# Patient Record
Sex: Female | Born: 1945 | Race: White | Hispanic: No | Marital: Single | Smoking: Former smoker
Health system: Southern US, Community
[De-identification: ages and names within clinical notes are randomized; demographics above are authoritative.]

## PROBLEM LIST (undated history)

## (undated) DIAGNOSIS — E119 Type 2 diabetes mellitus without complications: Secondary | ICD-10-CM

## (undated) DIAGNOSIS — N3281 Overactive bladder: Secondary | ICD-10-CM

## (undated) DIAGNOSIS — I10 Essential (primary) hypertension: Secondary | ICD-10-CM

## (undated) DIAGNOSIS — E7800 Pure hypercholesterolemia, unspecified: Secondary | ICD-10-CM

## (undated) DIAGNOSIS — K219 Gastro-esophageal reflux disease without esophagitis: Secondary | ICD-10-CM

## (undated) HISTORY — DX: Gastro-esophageal reflux disease without esophagitis: K21.9

## (undated) HISTORY — DX: Pure hypercholesterolemia, unspecified: E78.00

## (undated) HISTORY — DX: Essential (primary) hypertension: I10

## (undated) HISTORY — DX: Type 2 diabetes mellitus without complications: E11.9

## (undated) HISTORY — DX: Overactive bladder: N32.81

---

## 1975-12-17 HISTORY — PX: ABDOMINAL HYSTERECTOMY: SHX81

## 1999-05-30 ENCOUNTER — Ambulatory Visit (HOSPITAL_BASED_OUTPATIENT_CLINIC_OR_DEPARTMENT_OTHER): Admission: RE | Admit: 1999-05-30 | Discharge: 1999-05-30 | Payer: Self-pay | Admitting: Orthopedic Surgery

## 2000-09-25 ENCOUNTER — Ambulatory Visit (HOSPITAL_BASED_OUTPATIENT_CLINIC_OR_DEPARTMENT_OTHER): Admission: RE | Admit: 2000-09-25 | Discharge: 2000-09-25 | Payer: Self-pay | Admitting: Orthopaedic Surgery

## 2003-01-31 ENCOUNTER — Ambulatory Visit (HOSPITAL_COMMUNITY): Admission: RE | Admit: 2003-01-31 | Discharge: 2003-01-31 | Payer: Self-pay | Admitting: Family Medicine

## 2003-01-31 ENCOUNTER — Encounter: Payer: Self-pay | Admitting: Family Medicine

## 2003-02-14 ENCOUNTER — Encounter: Payer: Self-pay | Admitting: Family Medicine

## 2003-02-14 ENCOUNTER — Ambulatory Visit (HOSPITAL_COMMUNITY): Admission: RE | Admit: 2003-02-14 | Discharge: 2003-02-14 | Payer: Self-pay | Admitting: Family Medicine

## 2010-04-27 ENCOUNTER — Inpatient Hospital Stay (HOSPITAL_COMMUNITY): Admission: RE | Admit: 2010-04-27 | Discharge: 2010-05-02 | Payer: Self-pay | Admitting: Internal Medicine

## 2010-04-30 ENCOUNTER — Encounter (INDEPENDENT_AMBULATORY_CARE_PROVIDER_SITE_OTHER): Payer: Self-pay

## 2011-03-04 LAB — DIFFERENTIAL
Basophils Absolute: 0 10*3/uL (ref 0.0–0.1)
Basophils Absolute: 0 10*3/uL (ref 0.0–0.1)
Basophils Absolute: 0 10*3/uL (ref 0.0–0.1)
Basophils Relative: 0 % (ref 0–1)
Eosinophils Absolute: 0.3 10*3/uL (ref 0.0–0.7)
Eosinophils Absolute: 0.3 10*3/uL (ref 0.0–0.7)
Eosinophils Relative: 3 % (ref 0–5)
Eosinophils Relative: 4 % (ref 0–5)
Lymphocytes Relative: 13 % (ref 12–46)
Lymphocytes Relative: 16 % (ref 12–46)
Lymphocytes Relative: 8 % — ABNORMAL LOW (ref 12–46)
Lymphs Abs: 0.8 10*3/uL (ref 0.7–4.0)
Monocytes Absolute: 0.7 10*3/uL (ref 0.1–1.0)
Monocytes Relative: 7 % (ref 3–12)
Neutro Abs: 4.6 10*3/uL (ref 1.7–7.7)
Neutro Abs: 8.6 10*3/uL — ABNORMAL HIGH (ref 1.7–7.7)
Neutrophils Relative %: 75 % (ref 43–77)
Neutrophils Relative %: 82 % — ABNORMAL HIGH (ref 43–77)

## 2011-03-04 LAB — CBC
HCT: 31.5 % — ABNORMAL LOW (ref 36.0–46.0)
HCT: 34.3 % — ABNORMAL LOW (ref 36.0–46.0)
Hemoglobin: 11.2 g/dL — ABNORMAL LOW (ref 12.0–15.0)
Hemoglobin: 12.1 g/dL (ref 12.0–15.0)
MCHC: 35.2 g/dL (ref 30.0–36.0)
MCV: 91.2 fL (ref 78.0–100.0)
MCV: 91.5 fL (ref 78.0–100.0)
Platelets: 249 10*3/uL (ref 150–400)
Platelets: 263 10*3/uL (ref 150–400)
RBC: 3.45 MIL/uL — ABNORMAL LOW (ref 3.87–5.11)
RBC: 3.74 MIL/uL — ABNORMAL LOW (ref 3.87–5.11)
RDW: 12.6 % (ref 11.5–15.5)
RDW: 12.6 % (ref 11.5–15.5)
WBC: 10.5 10*3/uL (ref 4.0–10.5)
WBC: 7.8 10*3/uL (ref 4.0–10.5)

## 2011-03-04 LAB — BASIC METABOLIC PANEL
BUN: 10 mg/dL (ref 6–23)
BUN: 4 mg/dL — ABNORMAL LOW (ref 6–23)
CO2: 27 mEq/L (ref 19–32)
Calcium: 8.1 mg/dL — ABNORMAL LOW (ref 8.4–10.5)
Calcium: 8.2 mg/dL — ABNORMAL LOW (ref 8.4–10.5)
Chloride: 99 mEq/L (ref 96–112)
Creatinine, Ser: 0.65 mg/dL (ref 0.4–1.2)
GFR calc Af Amer: >60 mL/min (ref >60)
GFR calc non Af Amer: >60 mL/min (ref >60)
GFR calc non Af Amer: >60 mL/min (ref >60)
Glucose, Bld: 144 mg/dL — ABNORMAL HIGH (ref 70–99)
Glucose, Bld: 88 mg/dL (ref 70–99)
Potassium: 2.8 mEq/L — ABNORMAL LOW (ref 3.5–5.1)
Sodium: 133 mEq/L — ABNORMAL LOW (ref 135–145)
Sodium: 138 mEq/L (ref 135–145)

## 2011-03-04 LAB — COMPREHENSIVE METABOLIC PANEL
AST: 12 U/L (ref 0–37)
CO2: 28 mEq/L (ref 19–32)
Chloride: 102 mEq/L (ref 96–112)
Creatinine, Ser: 0.56 mg/dL (ref 0.4–1.2)
GFR calc Af Amer: >60 mL/min (ref >60)
GFR calc non Af Amer: >60 mL/min (ref >60)
Glucose, Bld: 101 mg/dL — ABNORMAL HIGH (ref 70–99)
Total Bilirubin: 0.8 mg/dL (ref 0.3–1.2)

## 2011-03-04 LAB — MAGNESIUM
Magnesium: 1.8 mg/dL (ref 1.5–2.5)
Magnesium: 2 mg/dL (ref 1.5–2.5)

## 2011-03-05 LAB — CBC
HCT: 36.4 % (ref 36.0–46.0)
Hemoglobin: 12.7 g/dL (ref 12.0–15.0)
Hemoglobin: 12.8 g/dL (ref 12.0–15.0)
MCHC: 35.5 g/dL (ref 30.0–36.0)
MCV: 91.1 fL (ref 78.0–100.0)
MCV: 91.4 fL (ref 78.0–100.0)
Platelets: 259 10*3/uL (ref 150–400)
RBC: 3.94 MIL/uL (ref 3.87–5.11)
WBC: 14.4 10*3/uL — ABNORMAL HIGH (ref 4.0–10.5)

## 2011-03-05 LAB — DIFFERENTIAL
Basophils Absolute: 0 10*3/uL (ref 0.0–0.1)
Basophils Relative: 0 % (ref 0–1)
Basophils Relative: 0 % (ref 0–1)
Lymphocytes Relative: 8 % — ABNORMAL LOW (ref 12–46)
Monocytes Absolute: 1.2 10*3/uL — ABNORMAL HIGH (ref 0.1–1.0)
Monocytes Relative: 7 % (ref 3–12)
Neutro Abs: 12 10*3/uL — ABNORMAL HIGH (ref 1.7–7.7)
Neutro Abs: 13.7 10*3/uL — ABNORMAL HIGH (ref 1.7–7.7)
Neutrophils Relative %: 84 % — ABNORMAL HIGH (ref 43–77)

## 2011-03-05 LAB — CULTURE, BLOOD (ROUTINE X 2): Culture: NO GROWTH

## 2011-03-05 LAB — COMPREHENSIVE METABOLIC PANEL
Albumin: 2.9 g/dL — ABNORMAL LOW (ref 3.5–5.2)
BUN: 16 mg/dL (ref 6–23)
CO2: 28 mEq/L (ref 19–32)
Chloride: 93 mEq/L — ABNORMAL LOW (ref 96–112)
Creatinine, Ser: 0.83 mg/dL (ref 0.4–1.2)
GFR calc non Af Amer: >60 mL/min (ref >60)
Glucose, Bld: 94 mg/dL (ref 70–99)
Total Bilirubin: 1.5 mg/dL — ABNORMAL HIGH (ref 0.3–1.2)

## 2011-05-03 NOTE — Op Note (Signed)
[place]  Patient:    Makayla Lucas, Makayla Lucas             MRN: [id number] Proc. Date: 09/25/00 Adm. Date:  [id number] Disc. Date: [id number] Attending:  Marcene Corning                           Operative Report  PREOPERATIVE DIAGNOSES: 1. Left knee torn medial meniscus. 2. Left knee chondromalacia patella.  POSTOPERATIVE DIAGNOSES: 1. Left knee torn medial and torn lateral menisci. 2. Left knee chondromalacia patella.  PROCEDURE: 1. Left knee partial medial and partial lateral meniscectomies. 2. Left knee chondroplasty patella.  ANESTHESIA:  General.  SURGEON:  Lubertha Basque. Jerl Santos, M.D.  ASSISTANT:  Prince Rome, P.A.  INDICATION:   The patient is a 65 year old woman with a long history of left knee pain.  This is persistent despite oral anti-inflammatories and other measures.  At this point, she is offered operative intervention to consist of an arthropathy. This is a procedure she has undergone on the opposite side in a successful fashion.  The procedure was discussed with the patient.  An informed operative consent was obtained after discussion of possible complications, as well as anesthesia effects.  DESCRIPTION OF PROCEDURE:  The patient was seen in the operative suite where general anesthetic was applied without difficulty.  She was positioned supine, prepped and draped in the normal sterile fashion.  After the administration of preapplied IV antibiotics, an arthroscopy of the left knee was performed through two inferior portals.  Suprapatellar pouch was benign while the patellofemoral joint showed a grade 3 chondromalacia across most of both surfaces. this required a thorough chondroplasty of the patellofemoral joint.  There is some cartilaginous loose bodies in the knee which were removed.  Both gutters were otherwise benign.  The medial compartment exhibited degenerative tear of the posterior horn of the medial  meniscus. this was addressed with a partial medial meniscectomy taking about 10% of this structure back to a stable rim.  She had some grade 3 changes of very small areas of the medial femoral condyle and a chondroplasty was performed here. ACL, TCL appeared intact and she had some significant reactive synovitis in the notch.  In the lateral compartment she had an area of grade change on the outside of the lateral femoral condyle about the size of a dime.  She had some loose cartilage around this which required a chondroplasty back to stable surface. She had a small tear in the lateral meniscus which was addressed with a partial lateral menisectomy taking about 10% of this structure.  The kinee joint was then thoroughly irrigated followed by replacing the Marcaine with epinephrine and morphine plus Depo-Medrol.  Adaptic was placed over the two portals followed by a dry gauze and a loose Ace wrap.  Estimated blood loss and intraoperative fluids obtained from anesthesia records.  DISPOSITION:  The patient was extubated in the operating room and taken to the recovery room in stable condition.  Plans were for her to go home same day and to follow up in the office in less than a week.  I will contact her by phone tonight. DD:  09/25/00 TD:  09/26/00 Job: [id number]

## 2012-02-07 DIAGNOSIS — D235 Other benign neoplasm of skin of trunk: Secondary | ICD-10-CM | POA: Diagnosis not present

## 2012-02-07 DIAGNOSIS — D485 Neoplasm of uncertain behavior of skin: Secondary | ICD-10-CM | POA: Diagnosis not present

## 2012-02-21 DIAGNOSIS — L98499 Non-pressure chronic ulcer of skin of other sites with unspecified severity: Secondary | ICD-10-CM | POA: Diagnosis not present

## 2012-08-04 DIAGNOSIS — L255 Unspecified contact dermatitis due to plants, except food: Secondary | ICD-10-CM | POA: Diagnosis not present

## 2012-12-07 DIAGNOSIS — J069 Acute upper respiratory infection, unspecified: Secondary | ICD-10-CM | POA: Diagnosis not present

## 2013-02-08 DIAGNOSIS — L259 Unspecified contact dermatitis, unspecified cause: Secondary | ICD-10-CM | POA: Diagnosis not present

## 2013-02-08 DIAGNOSIS — L570 Actinic keratosis: Secondary | ICD-10-CM | POA: Diagnosis not present

## 2013-02-08 DIAGNOSIS — L821 Other seborrheic keratosis: Secondary | ICD-10-CM | POA: Diagnosis not present

## 2013-02-08 DIAGNOSIS — D485 Neoplasm of uncertain behavior of skin: Secondary | ICD-10-CM | POA: Diagnosis not present

## 2013-12-16 HISTORY — PX: LAPAROSCOPIC CHOLECYSTECTOMY: SUR755

## 2013-12-27 DIAGNOSIS — J069 Acute upper respiratory infection, unspecified: Secondary | ICD-10-CM | POA: Diagnosis not present

## 2014-01-13 DIAGNOSIS — R609 Edema, unspecified: Secondary | ICD-10-CM | POA: Diagnosis not present

## 2014-01-13 DIAGNOSIS — I10 Essential (primary) hypertension: Secondary | ICD-10-CM | POA: Diagnosis not present

## 2014-01-13 DIAGNOSIS — Z0000 Encounter for general adult medical examination without abnormal findings: Secondary | ICD-10-CM | POA: Diagnosis not present

## 2014-01-13 DIAGNOSIS — K219 Gastro-esophageal reflux disease without esophagitis: Secondary | ICD-10-CM | POA: Diagnosis not present

## 2014-03-02 DIAGNOSIS — L821 Other seborrheic keratosis: Secondary | ICD-10-CM | POA: Diagnosis not present

## 2014-03-02 DIAGNOSIS — L570 Actinic keratosis: Secondary | ICD-10-CM | POA: Diagnosis not present

## 2014-03-02 DIAGNOSIS — D485 Neoplasm of uncertain behavior of skin: Secondary | ICD-10-CM | POA: Diagnosis not present

## 2014-07-14 DIAGNOSIS — I10 Essential (primary) hypertension: Secondary | ICD-10-CM | POA: Diagnosis not present

## 2014-07-14 DIAGNOSIS — R7301 Impaired fasting glucose: Secondary | ICD-10-CM | POA: Diagnosis not present

## 2014-07-18 DIAGNOSIS — E782 Mixed hyperlipidemia: Secondary | ICD-10-CM | POA: Diagnosis not present

## 2014-07-18 DIAGNOSIS — I10 Essential (primary) hypertension: Secondary | ICD-10-CM | POA: Diagnosis not present

## 2014-07-18 DIAGNOSIS — K219 Gastro-esophageal reflux disease without esophagitis: Secondary | ICD-10-CM | POA: Diagnosis not present

## 2014-07-18 DIAGNOSIS — R252 Cramp and spasm: Secondary | ICD-10-CM | POA: Diagnosis not present

## 2014-11-18 DIAGNOSIS — I10 Essential (primary) hypertension: Secondary | ICD-10-CM | POA: Diagnosis not present

## 2014-11-18 DIAGNOSIS — R7301 Impaired fasting glucose: Secondary | ICD-10-CM | POA: Diagnosis not present

## 2014-11-18 DIAGNOSIS — E785 Hyperlipidemia, unspecified: Secondary | ICD-10-CM | POA: Diagnosis not present

## 2014-11-22 DIAGNOSIS — E1165 Type 2 diabetes mellitus with hyperglycemia: Secondary | ICD-10-CM | POA: Diagnosis not present

## 2014-11-22 DIAGNOSIS — J209 Acute bronchitis, unspecified: Secondary | ICD-10-CM | POA: Diagnosis not present

## 2014-11-22 DIAGNOSIS — E782 Mixed hyperlipidemia: Secondary | ICD-10-CM | POA: Diagnosis not present

## 2014-11-22 DIAGNOSIS — I10 Essential (primary) hypertension: Secondary | ICD-10-CM | POA: Diagnosis not present

## 2014-11-22 DIAGNOSIS — J069 Acute upper respiratory infection, unspecified: Secondary | ICD-10-CM | POA: Diagnosis not present

## 2014-11-22 DIAGNOSIS — J019 Acute sinusitis, unspecified: Secondary | ICD-10-CM | POA: Diagnosis not present

## 2014-11-22 DIAGNOSIS — Z0000 Encounter for general adult medical examination without abnormal findings: Secondary | ICD-10-CM | POA: Diagnosis not present

## 2015-02-28 DIAGNOSIS — L570 Actinic keratosis: Secondary | ICD-10-CM | POA: Diagnosis not present

## 2015-02-28 DIAGNOSIS — I872 Venous insufficiency (chronic) (peripheral): Secondary | ICD-10-CM | POA: Diagnosis not present

## 2015-03-20 DIAGNOSIS — I10 Essential (primary) hypertension: Secondary | ICD-10-CM | POA: Diagnosis not present

## 2015-03-20 DIAGNOSIS — E782 Mixed hyperlipidemia: Secondary | ICD-10-CM | POA: Diagnosis not present

## 2015-03-20 DIAGNOSIS — E1165 Type 2 diabetes mellitus with hyperglycemia: Secondary | ICD-10-CM | POA: Diagnosis not present

## 2015-03-23 DIAGNOSIS — E782 Mixed hyperlipidemia: Secondary | ICD-10-CM | POA: Diagnosis not present

## 2015-03-23 DIAGNOSIS — Z6838 Body mass index (BMI) 38.0-38.9, adult: Secondary | ICD-10-CM | POA: Diagnosis not present

## 2015-03-23 DIAGNOSIS — E1165 Type 2 diabetes mellitus with hyperglycemia: Secondary | ICD-10-CM | POA: Diagnosis not present

## 2015-03-23 DIAGNOSIS — I10 Essential (primary) hypertension: Secondary | ICD-10-CM | POA: Diagnosis not present

## 2016-02-28 DIAGNOSIS — L570 Actinic keratosis: Secondary | ICD-10-CM | POA: Diagnosis not present

## 2016-02-28 DIAGNOSIS — L309 Dermatitis, unspecified: Secondary | ICD-10-CM | POA: Diagnosis not present

## 2016-03-27 DIAGNOSIS — I10 Essential (primary) hypertension: Secondary | ICD-10-CM | POA: Diagnosis not present

## 2016-03-27 DIAGNOSIS — E782 Mixed hyperlipidemia: Secondary | ICD-10-CM | POA: Diagnosis not present

## 2016-03-27 DIAGNOSIS — E1165 Type 2 diabetes mellitus with hyperglycemia: Secondary | ICD-10-CM | POA: Diagnosis not present

## 2016-04-02 DIAGNOSIS — E876 Hypokalemia: Secondary | ICD-10-CM | POA: Diagnosis not present

## 2016-04-02 DIAGNOSIS — E782 Mixed hyperlipidemia: Secondary | ICD-10-CM | POA: Diagnosis not present

## 2016-04-02 DIAGNOSIS — E119 Type 2 diabetes mellitus without complications: Secondary | ICD-10-CM | POA: Diagnosis not present

## 2016-04-02 DIAGNOSIS — I10 Essential (primary) hypertension: Secondary | ICD-10-CM | POA: Diagnosis not present

## 2016-04-11 DIAGNOSIS — R05 Cough: Secondary | ICD-10-CM | POA: Diagnosis not present

## 2016-04-11 DIAGNOSIS — J302 Other seasonal allergic rhinitis: Secondary | ICD-10-CM | POA: Diagnosis not present

## 2016-04-11 DIAGNOSIS — J209 Acute bronchitis, unspecified: Secondary | ICD-10-CM | POA: Diagnosis not present

## 2016-09-12 DIAGNOSIS — E785 Hyperlipidemia, unspecified: Secondary | ICD-10-CM | POA: Diagnosis not present

## 2016-09-12 DIAGNOSIS — D509 Iron deficiency anemia, unspecified: Secondary | ICD-10-CM | POA: Diagnosis not present

## 2016-09-12 DIAGNOSIS — I10 Essential (primary) hypertension: Secondary | ICD-10-CM | POA: Diagnosis not present

## 2016-09-12 DIAGNOSIS — E039 Hypothyroidism, unspecified: Secondary | ICD-10-CM | POA: Diagnosis not present

## 2016-09-12 DIAGNOSIS — E782 Mixed hyperlipidemia: Secondary | ICD-10-CM | POA: Diagnosis not present

## 2016-09-12 DIAGNOSIS — I482 Chronic atrial fibrillation: Secondary | ICD-10-CM | POA: Diagnosis not present

## 2016-09-12 DIAGNOSIS — R7301 Impaired fasting glucose: Secondary | ICD-10-CM | POA: Diagnosis not present

## 2016-09-12 DIAGNOSIS — E119 Type 2 diabetes mellitus without complications: Secondary | ICD-10-CM | POA: Diagnosis not present

## 2016-09-16 DIAGNOSIS — K219 Gastro-esophageal reflux disease without esophagitis: Secondary | ICD-10-CM | POA: Diagnosis not present

## 2016-09-16 DIAGNOSIS — E119 Type 2 diabetes mellitus without complications: Secondary | ICD-10-CM | POA: Diagnosis not present

## 2016-09-16 DIAGNOSIS — Z23 Encounter for immunization: Secondary | ICD-10-CM | POA: Diagnosis not present

## 2016-09-16 DIAGNOSIS — I10 Essential (primary) hypertension: Secondary | ICD-10-CM | POA: Diagnosis not present

## 2016-09-16 DIAGNOSIS — Z0000 Encounter for general adult medical examination without abnormal findings: Secondary | ICD-10-CM | POA: Diagnosis not present

## 2016-09-16 DIAGNOSIS — E876 Hypokalemia: Secondary | ICD-10-CM | POA: Diagnosis not present

## 2016-09-16 DIAGNOSIS — R32 Unspecified urinary incontinence: Secondary | ICD-10-CM | POA: Diagnosis not present

## 2016-09-16 DIAGNOSIS — E782 Mixed hyperlipidemia: Secondary | ICD-10-CM | POA: Diagnosis not present

## 2016-12-06 DIAGNOSIS — J060 Acute laryngopharyngitis: Secondary | ICD-10-CM | POA: Diagnosis not present

## 2017-02-26 DIAGNOSIS — L570 Actinic keratosis: Secondary | ICD-10-CM | POA: Diagnosis not present

## 2017-02-26 DIAGNOSIS — L309 Dermatitis, unspecified: Secondary | ICD-10-CM | POA: Diagnosis not present

## 2018-03-02 DIAGNOSIS — L821 Other seborrheic keratosis: Secondary | ICD-10-CM | POA: Diagnosis not present

## 2018-03-02 DIAGNOSIS — B079 Viral wart, unspecified: Secondary | ICD-10-CM | POA: Diagnosis not present

## 2018-03-02 DIAGNOSIS — D485 Neoplasm of uncertain behavior of skin: Secondary | ICD-10-CM | POA: Diagnosis not present

## 2018-03-02 DIAGNOSIS — L570 Actinic keratosis: Secondary | ICD-10-CM | POA: Diagnosis not present

## 2018-03-26 DIAGNOSIS — I10 Essential (primary) hypertension: Secondary | ICD-10-CM | POA: Diagnosis not present

## 2018-03-26 DIAGNOSIS — E119 Type 2 diabetes mellitus without complications: Secondary | ICD-10-CM | POA: Diagnosis not present

## 2018-03-26 DIAGNOSIS — E782 Mixed hyperlipidemia: Secondary | ICD-10-CM | POA: Diagnosis not present

## 2018-03-30 DIAGNOSIS — E119 Type 2 diabetes mellitus without complications: Secondary | ICD-10-CM | POA: Diagnosis not present

## 2018-03-30 DIAGNOSIS — Z6837 Body mass index (BMI) 37.0-37.9, adult: Secondary | ICD-10-CM | POA: Diagnosis not present

## 2018-03-30 DIAGNOSIS — E782 Mixed hyperlipidemia: Secondary | ICD-10-CM | POA: Diagnosis not present

## 2018-03-30 DIAGNOSIS — I10 Essential (primary) hypertension: Secondary | ICD-10-CM | POA: Diagnosis not present

## 2018-03-30 DIAGNOSIS — K219 Gastro-esophageal reflux disease without esophagitis: Secondary | ICD-10-CM | POA: Diagnosis not present

## 2018-03-30 DIAGNOSIS — Z0000 Encounter for general adult medical examination without abnormal findings: Secondary | ICD-10-CM | POA: Diagnosis not present

## 2018-03-30 DIAGNOSIS — R32 Unspecified urinary incontinence: Secondary | ICD-10-CM | POA: Diagnosis not present

## 2018-03-31 ENCOUNTER — Other Ambulatory Visit (HOSPITAL_COMMUNITY): Payer: Self-pay | Admitting: Internal Medicine

## 2018-03-31 DIAGNOSIS — Z780 Asymptomatic menopausal state: Secondary | ICD-10-CM

## 2018-04-08 ENCOUNTER — Other Ambulatory Visit (HOSPITAL_COMMUNITY): Payer: Self-pay | Admitting: Internal Medicine

## 2018-04-08 DIAGNOSIS — Z1231 Encounter for screening mammogram for malignant neoplasm of breast: Secondary | ICD-10-CM

## 2018-04-17 ENCOUNTER — Other Ambulatory Visit (HOSPITAL_COMMUNITY): Payer: Self-pay

## 2018-04-17 ENCOUNTER — Ambulatory Visit (HOSPITAL_COMMUNITY): Payer: Self-pay

## 2018-04-22 ENCOUNTER — Encounter (HOSPITAL_COMMUNITY): Payer: Self-pay | Admitting: Radiology

## 2018-04-22 ENCOUNTER — Ambulatory Visit (HOSPITAL_COMMUNITY)
Admission: RE | Admit: 2018-04-22 | Discharge: 2018-04-22 | Disposition: A | Discharge status: Home / Self Care | Payer: Medicare Other | Source: Ambulatory Visit | Attending: Internal Medicine | Admitting: Internal Medicine

## 2018-04-22 DIAGNOSIS — Z780 Asymptomatic menopausal state: Secondary | ICD-10-CM

## 2018-04-22 DIAGNOSIS — Z13820 Encounter for screening for osteoporosis: Secondary | ICD-10-CM | POA: Insufficient documentation

## 2018-04-22 DIAGNOSIS — Z1231 Encounter for screening mammogram for malignant neoplasm of breast: Secondary | ICD-10-CM

## 2018-04-23 ENCOUNTER — Other Ambulatory Visit (HOSPITAL_COMMUNITY): Payer: Self-pay

## 2018-04-29 DIAGNOSIS — Z1212 Encounter for screening for malignant neoplasm of rectum: Secondary | ICD-10-CM | POA: Diagnosis not present

## 2018-04-29 DIAGNOSIS — Z1211 Encounter for screening for malignant neoplasm of colon: Secondary | ICD-10-CM | POA: Diagnosis not present

## 2018-04-29 LAB — COLOGUARD

## 2018-05-25 ENCOUNTER — Encounter: Payer: Self-pay | Admitting: Internal Medicine

## 2018-08-20 ENCOUNTER — Ambulatory Visit: Payer: Medicare Other | Admitting: Nurse Practitioner

## 2018-09-03 ENCOUNTER — Encounter: Payer: Self-pay | Admitting: Nurse Practitioner

## 2018-09-03 ENCOUNTER — Other Ambulatory Visit: Payer: Self-pay

## 2018-09-03 ENCOUNTER — Ambulatory Visit (INDEPENDENT_AMBULATORY_CARE_PROVIDER_SITE_OTHER): Payer: Medicare Other | Admitting: Nurse Practitioner

## 2018-09-03 DIAGNOSIS — R195 Other fecal abnormalities: Secondary | ICD-10-CM | POA: Diagnosis not present

## 2018-09-03 MED ORDER — NA SULFATE-K SULFATE-MG SULF 17.5-3.13-1.6 GM/177ML PO SOLN: 1.0000 | ORAL | 0 refills | Status: DC

## 2018-09-03 NOTE — Assessment & Plan Note (Signed)
The patient had a positive Cologuard with primary care.  She denies overt GI symptoms.  Specifically, no hematochezia, melena, change in stool habits, unintentional weight loss.  Generally no red flag or warning signs or symptoms.  Asymptomatic from a GI standpoint at this time.  We will proceed with colonoscopy due to positive Cologuard.  Return for follow-up based on post procedure recommendations.  Proceed with TCS with Dr. Gala Romney in near future: the risks, benefits, and alternatives have been discussed with the patient in detail. The patient states understanding and desires to proceed.  The patient is not on any anticoagulants, anxiolytics, chronic pain medications, or antidepressants.  Conscious sedation should be adequate for her procedure.

## 2018-09-03 NOTE — Progress Notes (Signed)
CC'D TO PCP °

## 2018-09-03 NOTE — Patient Instructions (Signed)
1. We will schedule your colonoscopy for you. 2. Further recommendations will be made after your colonoscopy. 3. Return for follow-up based on the recommendations made after your colonoscopy. 4. Call us if you have any questions or concerns.  At [facility] we value your feedback. You may receive a survey about your visit today. Please share your experience as we strive to create trusting relationships with our patients to provide genuine, compassionate, quality care.  We appreciate your understanding and patience as we review any laboratory studies, imaging, and other diagnostic tests that are ordered as we care for you. Our office policy is 5 business days for review of these results, and any emergent or urgent results are addressed in a timely manner for your best interest. If you do not hear from our office in 1 week, please contact us.   We also encourage the use of MyChart, which contains your medical information for your review as well. If you are not enrolled in this feature, an access code is on this after visit summary for your convenience. Thank you for allowing [place] to be involved in your care.  It was great to meet you today!  I hope you have a great Fall!!

## 2018-09-03 NOTE — Patient Instructions (Signed)
EG advised for pt to take 1/2 dose of Metformin night before, none morning of TCS. Noted on pt's instructions.

## 2018-09-03 NOTE — Progress Notes (Signed)
Primary Care Physician:  Celene Squibb, MD Primary Gastroenterologist:  Dr. Gala Romney  Chief Complaint  Patient presents with  . positive cologuard    never had TCS.     HPI:   Makayla Lucas is a 72 y.o. female who presents on referral from primary care for positive Cologuard.  Reviewed information provided with the referral including office visit dated 03/30/2018.  At that time it was noted the patient is due for colon cancer screening and elected Cologuard.  Cologuard result dated 04/29/2018 was positive.  CBC completed 03/26/2018 was normal, CMP essentially normal.  No history of colonoscopy in our system.  Today she states she's doing well overall. Has never had a colonoscopy before. Denies abdominal pain, N/V, hematochezia, melena, fever, chills, unintentional weight loss, acute changes in bowel movements; occasional/rare diarrhea. Denies chest pain, dyspnea, dizziness, lightheadedness, syncope, near syncope. Denies any other upper or lower GI symptoms.   Past Medical History:  Diagnosis Date  . Diabetes ([place])   . GERD (gastroesophageal reflux disease)   . Hypercholesterolemia   . Hypertension   . OAB (overactive bladder)     Past Surgical History:  Procedure Laterality Date  . ABDOMINAL HYSTERECTOMY  1977  . LAPAROSCOPIC CHOLECYSTECTOMY  2015    Current Outpatient Medications  Medication Sig Dispense Refill  . lisinopril-hydrochlorothiazide (PRINZIDE,ZESTORETIC) 20-12.5 MG tablet Take 1 tablet by mouth daily.    . metFORMIN (GLUCOPHAGE) 500 MG tablet Take 500 mg by mouth at bedtime.    Marland Kitchen omeprazole (PRILOSEC) 20 MG capsule Take 20 mg by mouth daily.    Marland Kitchen oxybutynin (DITROPAN) 5 MG tablet Take 1 tablet by mouth daily.    . potassium chloride (K-DUR,KLOR-CON) 10 MEQ tablet Take 1 tablet by mouth daily.    . pravastatin (PRAVACHOL) 20 MG tablet Take 1 tablet by mouth daily.     No current facility-administered medications for this visit.     Allergies as of 09/03/2018   . (Not on File)    Family History  Problem Relation Age of Onset  . Breast cancer Maternal Aunt   . Colon cancer Neg Hx     Social History   Socioeconomic History  . Marital status: Single    Spouse name: Not on file  . Number of children: Not on file  . Years of education: Not on file  . Highest education level: Not on file  Occupational History  . Not on file  Social Needs  . Financial resource strain: Not on file  . Food insecurity:    Worry: Not on file    Inability: Not on file  . Transportation needs:    Medical: Not on file    Non-medical: Not on file  Tobacco Use  . Smoking status: Former Smoker    Types: Cigarettes  . Smokeless tobacco: Never Used  Substance and Sexual Activity  . Alcohol use: Yes    Comment: occas  . Drug use: Never  . Sexual activity: Not on file  Lifestyle  . Physical activity:    Days per week: Not on file    Minutes per session: Not on file  . Stress: Not on file  Relationships  . Social connections:    Talks on phone: Not on file    Gets together: Not on file    Attends religious service: Not on file    Active member of club or organization: Not on file    Attends meetings of clubs or organizations: Not on  file    Relationship status: Not on file  . Intimate partner violence:    Fear of current or ex partner: Not on file    Emotionally abused: Not on file    Physically abused: Not on file    Forced sexual activity: Not on file  Other Topics Concern  . Not on file  Social History Narrative  . Not on file    Review of Systems: General: Negative for anorexia, weight loss, fever, chills, fatigue, weakness. Eyes: Negative for vision changes.  ENT: Negative for hoarseness, difficulty swallowing , nasal congestion. CV: Negative for chest pain, angina, palpitations, dyspnea on exertion, peripheral edema.  Respiratory: Negative for dyspnea at rest, dyspnea on exertion, cough, sputum, wheezing.  GI: See history of present  illness. GU:  Negative for dysuria, hematuria, urinary incontinence, urinary frequency, nocturnal urination.  MS: Negative for joint pain, low back pain.  Derm: Negative for rash or itching.  Neuro: Negative for weakness, abnormal sensation, seizure, frequent headaches, memory loss, confusion.  Psych: Negative for anxiety, depression, suicidal ideation, hallucinations.  Endo: Negative for unusual weight change.  Heme: Negative for bruising or bleeding. Allergy: Negative for rash or hives.    Physical Exam: BP 134/86   Pulse 79   Temp 97.7 F (36.5 C) (Oral)   Ht 5\' 4"  (1.626 m)   Wt 219 lb 3.2 oz (99.4 kg)   BMI 37.63 kg/m  General:   Alert and oriented. Pleasant and cooperative. Well-nourished and well-developed.  Head:  Normocephalic and atraumatic. Eyes:  Without icterus, sclera clear and conjunctiva pink.  Ears:  Normal auditory acuity. Mouth:  No deformity or lesions, oral mucosa pink.  Throat/Neck:  Supple, without mass or thyromegaly. Cardiovascular:  S1, S2 present without murmurs appreciated. Normal pulses noted. Extremities without clubbing or edema. Respiratory:  Clear to auscultation bilaterally. No wheezes, rales, or rhonchi. No distress.  Gastrointestinal:  +BS, soft, non-tender and non-distended. No HSM noted. No guarding or rebound. No masses appreciated.  Rectal:  Deferred  Musculoskalatal:  Symmetrical without gross deformities. Normal posture. Skin:  Intact without significant lesions or rashes. Neurologic:  Alert and oriented x4;  grossly normal neurologically. Psych:  Alert and cooperative. Normal mood and affect. Heme/Lymph/Immune: No significant cervical adenopathy. No excessive bruising noted.    09/03/2018 9:16 AM   Disclaimer: This note was dictated with voice recognition software. Similar sounding words can inadvertently be transcribed and may not be corrected upon review.

## 2018-10-15 DIAGNOSIS — Z6837 Body mass index (BMI) 37.0-37.9, adult: Secondary | ICD-10-CM | POA: Diagnosis not present

## 2018-10-15 DIAGNOSIS — E119 Type 2 diabetes mellitus without complications: Secondary | ICD-10-CM | POA: Diagnosis not present

## 2018-10-15 DIAGNOSIS — Z0000 Encounter for general adult medical examination without abnormal findings: Secondary | ICD-10-CM | POA: Diagnosis not present

## 2018-10-15 DIAGNOSIS — E782 Mixed hyperlipidemia: Secondary | ICD-10-CM | POA: Diagnosis not present

## 2018-10-15 DIAGNOSIS — K219 Gastro-esophageal reflux disease without esophagitis: Secondary | ICD-10-CM | POA: Diagnosis not present

## 2018-10-15 DIAGNOSIS — I10 Essential (primary) hypertension: Secondary | ICD-10-CM | POA: Diagnosis not present

## 2018-10-15 DIAGNOSIS — R32 Unspecified urinary incontinence: Secondary | ICD-10-CM | POA: Diagnosis not present

## 2018-10-16 ENCOUNTER — Other Ambulatory Visit: Payer: Self-pay

## 2018-10-16 ENCOUNTER — Encounter (HOSPITAL_COMMUNITY)
Admission: RE | Disposition: A | Discharge status: Home / Self Care | Payer: Self-pay | Source: Ambulatory Visit | Attending: Internal Medicine

## 2018-10-16 ENCOUNTER — Ambulatory Visit (HOSPITAL_COMMUNITY)
Admission: RE | Admit: 2018-10-16 | Discharge: 2018-10-16 | Disposition: A | Discharge status: Home / Self Care | Payer: Medicare Other | Source: Ambulatory Visit | Attending: Internal Medicine | Admitting: Internal Medicine

## 2018-10-16 ENCOUNTER — Encounter (HOSPITAL_COMMUNITY): Payer: Self-pay | Admitting: *Deleted

## 2018-10-16 DIAGNOSIS — Z7984 Long term (current) use of oral hypoglycemic drugs: Secondary | ICD-10-CM | POA: Diagnosis not present

## 2018-10-16 DIAGNOSIS — I10 Essential (primary) hypertension: Secondary | ICD-10-CM | POA: Insufficient documentation

## 2018-10-16 DIAGNOSIS — E119 Type 2 diabetes mellitus without complications: Secondary | ICD-10-CM | POA: Diagnosis not present

## 2018-10-16 DIAGNOSIS — Z87891 Personal history of nicotine dependence: Secondary | ICD-10-CM | POA: Diagnosis not present

## 2018-10-16 DIAGNOSIS — K219 Gastro-esophageal reflux disease without esophagitis: Secondary | ICD-10-CM | POA: Insufficient documentation

## 2018-10-16 DIAGNOSIS — D128 Benign neoplasm of rectum: Secondary | ICD-10-CM

## 2018-10-16 DIAGNOSIS — E7800 Pure hypercholesterolemia, unspecified: Secondary | ICD-10-CM | POA: Insufficient documentation

## 2018-10-16 DIAGNOSIS — D120 Benign neoplasm of cecum: Secondary | ICD-10-CM | POA: Insufficient documentation

## 2018-10-16 DIAGNOSIS — N3281 Overactive bladder: Secondary | ICD-10-CM | POA: Diagnosis not present

## 2018-10-16 DIAGNOSIS — K621 Rectal polyp: Secondary | ICD-10-CM | POA: Diagnosis not present

## 2018-10-16 DIAGNOSIS — K635 Polyp of colon: Secondary | ICD-10-CM

## 2018-10-16 DIAGNOSIS — R195 Other fecal abnormalities: Secondary | ICD-10-CM

## 2018-10-16 DIAGNOSIS — D125 Benign neoplasm of sigmoid colon: Secondary | ICD-10-CM | POA: Diagnosis not present

## 2018-10-16 HISTORY — PX: POLYPECTOMY: SHX5525

## 2018-10-16 HISTORY — PX: COLONOSCOPY: SHX5424

## 2018-10-16 LAB — GLUCOSE, CAPILLARY: GLUCOSE-CAPILLARY: 137 mg/dL — AB (ref 70–99)

## 2018-10-16 SURGERY — COLONOSCOPY
Anesthesia: Moderate Sedation

## 2018-10-16 MED ORDER — MIDAZOLAM HCL 5 MG/5ML IJ SOLN
INTRAMUSCULAR | Status: DC | PRN
  Administered 2018-10-16 (×3): 1 mg via INTRAVENOUS

## 2018-10-16 MED ORDER — STERILE WATER FOR IRRIGATION IR SOLN
Status: DC | PRN
  Administered 2018-10-16: 09:00:00

## 2018-10-16 MED ORDER — MIDAZOLAM HCL 5 MG/5ML IJ SOLN
INTRAMUSCULAR | Status: DC | PRN
  Administered 2018-10-16: 1 mg via INTRAVENOUS

## 2018-10-16 MED ORDER — MEPERIDINE HCL 100 MG/ML IJ SOLN
INTRAMUSCULAR | Status: DC | PRN
  Administered 2018-10-16: 25 mg via INTRAVENOUS

## 2018-10-16 MED ORDER — ONDANSETRON HCL 4 MG/2ML IJ SOLN
INTRAMUSCULAR | Status: AC
  Filled 2018-10-16: qty 2

## 2018-10-16 MED ORDER — ONDANSETRON HCL 4 MG/2ML IJ SOLN
INTRAMUSCULAR | Status: DC | PRN
  Administered 2018-10-16: 4 mg via INTRAVENOUS

## 2018-10-16 MED ORDER — MEPERIDINE HCL 50 MG/ML IJ SOLN
INTRAMUSCULAR | Status: AC
  Filled 2018-10-16: qty 1

## 2018-10-16 MED ORDER — MIDAZOLAM HCL 5 MG/5ML IJ SOLN
INTRAMUSCULAR | Status: AC
  Filled 2018-10-16: qty 10

## 2018-10-16 MED ORDER — SODIUM CHLORIDE 0.9 % IV SOLN
INTRAVENOUS | Status: DC
  Administered 2018-10-16: 1000 mL via INTRAVENOUS

## 2018-10-16 NOTE — Discharge Instructions (Signed)
Colon Polyps °Polyps are tissue growths inside the body. Polyps can grow in many places, including the large intestine (colon). A polyp may be a round bump or a mushroom-shaped growth. You could have one polyp or several. °Most colon polyps are noncancerous (benign). However, some colon polyps can become cancerous over time. °What are the causes? °The exact cause of colon polyps is not known. °What increases the risk? °This condition is more likely to develop in people who: °· Have a family history of colon cancer or colon polyps. °· Are older than 50 or older than 45 if they are African American. °· Have inflammatory bowel disease, such as ulcerative colitis or Crohn disease. °· Are overweight. °· Smoke cigarettes. °· Do not get enough exercise. °· Drink too much alcohol. °· Eat a diet that is: °? High in fat and red meat. °? Low in fiber. °· Had childhood cancer that was treated with abdominal radiation. ° °What are the signs or symptoms? °Most polyps do not cause symptoms. If you have symptoms, they may include: °· Blood coming from your rectum when having a bowel movement. °· Blood in your stool. The stool may look dark red or black. °· A change in bowel habits, such as constipation or diarrhea. ° °How is this diagnosed? °This condition is diagnosed with a colonoscopy. This is a procedure that uses a lighted, flexible scope to look at the inside of your colon. °How is this treated? °Treatment for this condition involves removing any polyps that are found. Those polyps will then be tested for cancer. If cancer is found, your health care provider will talk to you about options for colon cancer treatment. °Follow these instructions at home: °Diet °· Eat plenty of fiber, such as fruits, vegetables, and whole grains. °· Eat foods that are high in calcium and vitamin D, such as milk, cheese, yogurt, eggs, liver, fish, and broccoli. °· Limit foods high in fat, red meats, and processed meats, such as hot dogs, sausage,  bacon, and lunch meats. °· Maintain a healthy weight, or lose weight if recommended by your health care provider. °General instructions °· Do not smoke cigarettes. °· Do not drink alcohol excessively. °· Keep all follow-up visits as told by your health care provider. This is important. This includes keeping regularly scheduled colonoscopies. Talk to your health care provider about when you need a colonoscopy. °· Exercise every day or as told by your health care provider. °Contact a health care provider if: °· You have new or worsening bleeding during a bowel movement. °· You have new or increased blood in your stool. °· You have a change in bowel habits. °· You unexpectedly lose weight. °This information is not intended to replace advice given to you by your health care provider. Make sure you discuss any questions you have with your health care provider. °Document Released: 08/28/2004 Document Revised: 05/09/2016 Document Reviewed: 10/23/2015 °Elsevier Interactive Patient Education © 2018 [employer] ° °Colonoscopy °Discharge Instructions ° °Read the instructions outlined below and refer to this sheet in the next few weeks. These discharge instructions provide you with general information on caring for yourself after you leave the hospital. Your doctor may also give you specific instructions. While your treatment has been planned according to the most current medical practices available, unavoidable complications occasionally occur. If you have any problems or questions after discharge, call Dr. Rourk at [phone]. °ACTIVITY °· You may resume your regular activity, but move at a slower pace for the next 24   hours.  °· Take frequent rest periods for the next 24 hours.  °· Walking will help get rid of the air and reduce the bloated feeling in your belly (abdomen).  °· No driving for 24 hours (because of the medicine (anesthesia) used during the test).   °· Do not sign any important legal documents or operate any  machinery for 24 hours (because of the anesthesia used during the test).  °NUTRITION °· Drink plenty of fluids.  °· You may resume your normal diet as instructed by your doctor.  °· Begin with a light meal and progress to your normal diet. Heavy or fried foods are harder to digest and may make you feel sick to your stomach (nauseated).  °· Avoid alcoholic beverages for 24 hours or as instructed.  °MEDICATIONS °· You may resume your normal medications unless your doctor tells you otherwise.  °WHAT YOU CAN EXPECT TODAY °· Some feelings of bloating in the abdomen.  °· Passage of more gas than usual.  °· Spotting of blood in your stool or on the toilet paper.  °IF YOU HAD POLYPS REMOVED DURING THE COLONOSCOPY: °· No aspirin products for 7 days or as instructed.  °· No alcohol for 7 days or as instructed.  °· Eat a soft diet for the next 24 hours.  °FINDING OUT THE RESULTS OF YOUR TEST °Not all test results are available during your visit. If your test results are not back during the visit, make an appointment with your caregiver to find out the results. Do not assume everything is normal if you have not heard from your caregiver or the medical facility. It is important for you to follow up on all of your test results.  °SEEK IMMEDIATE MEDICAL ATTENTION IF: °· You have more than a spotting of blood in your stool.  °· Your belly is swollen (abdominal distention).  °· You are nauseated or vomiting.  °· You have a temperature over 101.  °· You have abdominal pain or discomfort that is severe or gets worse throughout the day.  ° ° ° °Colon polyp information provided ° °Further recommendations to follow pending review of pathology report °

## 2018-10-16 NOTE — Op Note (Signed)
[facility] Patient Name: Makayla Lucas Procedure Date: 10/16/2018 8:44 AM MRN: [id number] Date of Birth: 09/21/1946 Attending MD: Norvel Richards , MD CSN: [id number] Age: 72 Admit Type: Outpatient Procedure:                Colonoscopy Indications:              Positive Cologuard test Providers:                Norvel Richards, MD, Jeanann Lewandowsky. Gwenlyn Perking RN, RN,                            Aram Candela Referring MD:              Medicines:                Midazolam 4 mg IV, Meperidine 25 mg IV Complications:            No immediate complications. Estimated Blood Loss:     Estimated blood loss was minimal. Procedure:                Pre-Anesthesia Assessment:                           - Prior to the procedure, a History and Physical                            was performed, and patient medications and                            allergies were reviewed. The patient's tolerance of                            previous anesthesia was also reviewed. The risks                            and benefits of the procedure and the sedation                            options and risks were discussed with the patient.                            All questions were answered, and informed consent                            was obtained. Prior Anticoagulants: The patient has                            taken no previous anticoagulant or antiplatelet                            agents. ASA Grade Assessment: II - A patient with                            mild systemic disease. After reviewing the risks  and benefits, the patient was deemed in                            satisfactory condition to undergo the procedure.                           After obtaining informed consent, the colonoscope                            was passed under direct vision. Throughout the                            procedure, the patient's blood pressure, pulse, and                            oxygen  saturations were monitored continuously. The                            [id number]-[id number] ([id number]) scope was introduced through                            the anus and advanced to the the cecum, identified                            by appendiceal orifice and ileocecal valve. The                            colonoscopy was performed without difficulty. The                            patient tolerated the procedure well. The quality                            of the bowel preparation was adequate. Scope In: 9:18:12 AM Scope Out: 9:39:36 AM Scope Withdrawal Time: 0 hours 15 minutes 42 seconds  Total Procedure Duration: 0 hours 21 minutes 24 seconds  Findings:      The perianal and digital rectal examinations were normal.      Two sessile polyps were found in the rectum and ileocecal valve. The       polyps were 9 to 11 mm in size (11 sessile polyps 2 cm from anal       verge).. These polyps were removed with hot snare. Resection and       retrieval were complete. Estimated blood loss: none. (40 5-6 mm sessile       opyps removed from ICV and sigmoid segments.      The exam was otherwise without abnormality on direct and retroflexion       views. Impression:               - Two 9 to 11 mm polyps in the rectum and at the                            ileocecal valve, removed with a hot snare. Resected  and retrieved. 4 polyps cold snared as above.                           - The examination was otherwise normal on direct                            and retroflexion views. Moderate Sedation:      Moderate (conscious) sedation was administered by the endoscopy nurse       and supervised by the endoscopist. The following parameters were       monitored: oxygen saturation, heart rate, blood pressure, respiratory       rate, EKG, adequacy of pulmonary ventilation, and response to care.       Total physician intraservice time was 26 minutes. Recommendation:           - Patient has  a contact number available for                            emergencies. The signs and symptoms of potential                            delayed complications were discussed with the                            patient. Return to normal activities tomorrow.                            Written discharge instructions were provided to the                            patient.                           - Resume previous diet.                           - Continue present medications.                           - Await pathology results.                           - Repeat colonoscopy date to be determined after                            pending pathology results are reviewed for                            surveillance.                           - Return to GI office (date not yet determined). Procedure Code(s):        --- Professional ---                           [phone], Colonoscopy, flexible; with removal of  tumor(s), polyp(s), or other lesion(s) by snare                            technique                           99153, Moderate sedation; each additional 15                            minutes intraservice time                           G0500, Moderate sedation services provided by the                            same physician or other qualified health care                            professional performing a gastrointestinal                            endoscopic service that sedation supports,                            requiring the presence of an independent trained                            observer to assist in the monitoring of the                            patient's level of consciousness and physiological                            status; initial 15 minutes of intra-service time;                            patient age 24 years or older (additional time may                            be reported with [id number], as appropriate) Diagnosis Code(s):        ---  Professional ---                           K62.1, Rectal polyp                           D12.0, Benign neoplasm of cecum                           R19.5, Other fecal abnormalities CPT copyright 2018 American Medical Association. All rights reserved. The codes documented in this report are preliminary and upon coder review may  be revised to meet current compliance requirements. Cristopher Estimable. Sury Wentworth, MD Norvel Richards, MD 10/16/2018 9:50:29 AM This report has been signed electronically. Number of Addenda: 0

## 2018-10-16 NOTE — H&P (Signed)
@LOGO @   Primary Care Physician:  Celene Squibb, MD Primary Gastroenterologist:  Dr. Gala Romney  Pre-Procedure History & Physical: HPI:  Makayla Lucas is a 72 y.o. female here  for further evaluation of positive fecal DNA test.  No prior colonoscopy.  Past Medical History:  Diagnosis Date  . Diabetes ([place])   . GERD (gastroesophageal reflux disease)   . Hypercholesterolemia   . Hypertension   . OAB (overactive bladder)     Past Surgical History:  Procedure Laterality Date  . ABDOMINAL HYSTERECTOMY  1977  . LAPAROSCOPIC CHOLECYSTECTOMY  2015    Prior to Admission medications   Medication Sig Start Date End Date Taking? Authorizing Provider  lisinopril-hydrochlorothiazide (PRINZIDE,ZESTORETIC) 20-12.5 MG tablet Take 1 tablet by mouth daily. 06/11/18  Yes [provider]  omeprazole (PRILOSEC) 20 MG capsule Take 20 mg by mouth daily.   Yes [provider]  oxybutynin (DITROPAN) 5 MG tablet Take 1 tablet by mouth daily. 07/06/18  Yes [provider]  potassium chloride (K-DUR,KLOR-CON) 10 MEQ tablet Take 1 tablet by mouth daily. 06/11/18  Yes [provider]  pravastatin (PRAVACHOL) 20 MG tablet Take 1 tablet by mouth daily.   Yes [provider]  metFORMIN (GLUCOPHAGE) 500 MG tablet Take 500 mg by mouth at bedtime.    [provider]    Allergies as of 09/03/2018  . (Not on File)    Family History  Problem Relation Age of Onset  . Breast cancer Maternal Aunt   . Colon cancer Neg Hx     Social History   Socioeconomic History  . Marital status: Single    Spouse name: Not on file  . Number of children: Not on file  . Years of education: Not on file  . Highest education level: Not on file  Occupational History  . Not on file  Social Needs  . Financial resource strain: Not on file  . Food insecurity:    Worry: Not on file    Inability: Not on file  . Transportation needs:    Medical: Not on file    Non-medical: Not  on file  Tobacco Use  . Smoking status: Former Smoker    Types: Cigarettes  . Smokeless tobacco: Never Used  Substance and Sexual Activity  . Alcohol use: Yes    Comment: seldom  . Drug use: Never  . Sexual activity: Not on file  Lifestyle  . Physical activity:    Days per week: Not on file    Minutes per session: Not on file  . Stress: Not on file  Relationships  . Social connections:    Talks on phone: Not on file    Gets together: Not on file    Attends religious service: Not on file    Active member of club or organization: Not on file    Attends meetings of clubs or organizations: Not on file    Relationship status: Not on file  . Intimate partner violence:    Fear of current or ex partner: Not on file    Emotionally abused: Not on file    Physically abused: Not on file    Forced sexual activity: Not on file  Other Topics Concern  . Not on file  Social History Narrative  . Not on file    Review of Systems: See HPI, otherwise negative ROS  Physical Exam: There were no vitals taken for this visit. General:   Alert,  Well-developed, well-nourished, pleasant and cooperative  in NAD Neck:  Supple; no masses or thyromegaly. No significant cervical adenopathy. Lungs:  Clear throughout to auscultation.   No wheezes, crackles, or rhonchi. No acute distress. Heart:  Regular rate and rhythm; no murmurs, clicks, rubs,  or gallops. Abdomen: Non-distended, normal bowel sounds.  Soft and nontender without appreciable mass or hepatosplenomegaly.  Pulses:  Normal pulses noted. Extremities:  Without clubbing or edema.  Impression/Plan: 72 year old lady here for a positive fecal DNA test.  Diagnostic colonoscopy now being done.  The risks, benefits, limitations, alternatives and imponderables have been reviewed with the patient. Questions have been answered. All parties are agreeable.      Notice: This dictation was prepared with Dragon dictation along with smaller phrase  technology. Any transcriptional errors that result from this process are unintentional and may not be corrected upon review.

## 2018-10-21 ENCOUNTER — Encounter (HOSPITAL_COMMUNITY): Payer: Self-pay | Admitting: Internal Medicine

## 2018-10-22 DIAGNOSIS — Z23 Encounter for immunization: Secondary | ICD-10-CM | POA: Diagnosis not present

## 2018-10-22 DIAGNOSIS — E1169 Type 2 diabetes mellitus with other specified complication: Secondary | ICD-10-CM | POA: Diagnosis not present

## 2018-10-22 DIAGNOSIS — E782 Mixed hyperlipidemia: Secondary | ICD-10-CM | POA: Diagnosis not present

## 2018-10-22 DIAGNOSIS — I10 Essential (primary) hypertension: Secondary | ICD-10-CM | POA: Diagnosis not present

## 2018-10-22 DIAGNOSIS — K219 Gastro-esophageal reflux disease without esophagitis: Secondary | ICD-10-CM | POA: Diagnosis not present

## 2018-10-22 DIAGNOSIS — R32 Unspecified urinary incontinence: Secondary | ICD-10-CM | POA: Diagnosis not present

## 2018-10-22 DIAGNOSIS — Z0000 Encounter for general adult medical examination without abnormal findings: Secondary | ICD-10-CM | POA: Diagnosis not present

## 2018-10-23 ENCOUNTER — Encounter: Payer: Self-pay | Admitting: Internal Medicine

## 2019-03-03 DIAGNOSIS — D485 Neoplasm of uncertain behavior of skin: Secondary | ICD-10-CM | POA: Diagnosis not present

## 2019-03-03 DIAGNOSIS — L821 Other seborrheic keratosis: Secondary | ICD-10-CM | POA: Diagnosis not present

## 2019-03-03 DIAGNOSIS — I872 Venous insufficiency (chronic) (peripheral): Secondary | ICD-10-CM | POA: Diagnosis not present

## 2019-03-03 DIAGNOSIS — L570 Actinic keratosis: Secondary | ICD-10-CM | POA: Diagnosis not present

## 2019-03-03 DIAGNOSIS — D2272 Melanocytic nevi of left lower limb, including hip: Secondary | ICD-10-CM | POA: Diagnosis not present

## 2019-05-05 DIAGNOSIS — E782 Mixed hyperlipidemia: Secondary | ICD-10-CM | POA: Diagnosis not present

## 2019-05-05 DIAGNOSIS — I10 Essential (primary) hypertension: Secondary | ICD-10-CM | POA: Diagnosis not present

## 2019-05-05 DIAGNOSIS — E119 Type 2 diabetes mellitus without complications: Secondary | ICD-10-CM | POA: Diagnosis not present

## 2019-05-11 DIAGNOSIS — I10 Essential (primary) hypertension: Secondary | ICD-10-CM | POA: Diagnosis not present

## 2019-05-11 DIAGNOSIS — E782 Mixed hyperlipidemia: Secondary | ICD-10-CM | POA: Diagnosis not present

## 2019-05-11 DIAGNOSIS — E1169 Type 2 diabetes mellitus with other specified complication: Secondary | ICD-10-CM | POA: Diagnosis not present

## 2019-05-11 DIAGNOSIS — Z86010 Personal history of colonic polyps: Secondary | ICD-10-CM | POA: Diagnosis not present

## 2019-05-11 DIAGNOSIS — R32 Unspecified urinary incontinence: Secondary | ICD-10-CM | POA: Diagnosis not present

## 2019-05-11 DIAGNOSIS — K219 Gastro-esophageal reflux disease without esophagitis: Secondary | ICD-10-CM | POA: Diagnosis not present

## 2019-06-01 ENCOUNTER — Other Ambulatory Visit (HOSPITAL_COMMUNITY): Payer: Self-pay | Admitting: Internal Medicine

## 2019-06-01 DIAGNOSIS — Z1231 Encounter for screening mammogram for malignant neoplasm of breast: Secondary | ICD-10-CM

## 2019-06-07 ENCOUNTER — Ambulatory Visit (HOSPITAL_COMMUNITY)
Admission: RE | Admit: 2019-06-07 | Discharge: 2019-06-07 | Disposition: A | Discharge status: Home / Self Care | Payer: Medicare Other | Source: Ambulatory Visit | Attending: Internal Medicine | Admitting: Internal Medicine

## 2019-06-07 ENCOUNTER — Other Ambulatory Visit: Payer: Self-pay

## 2019-06-07 DIAGNOSIS — Z1231 Encounter for screening mammogram for malignant neoplasm of breast: Secondary | ICD-10-CM | POA: Diagnosis not present

## 2019-07-19 ENCOUNTER — Other Ambulatory Visit: Payer: Self-pay

## 2019-11-15 DIAGNOSIS — I10 Essential (primary) hypertension: Secondary | ICD-10-CM | POA: Diagnosis not present

## 2019-11-15 DIAGNOSIS — E119 Type 2 diabetes mellitus without complications: Secondary | ICD-10-CM | POA: Diagnosis not present

## 2019-11-15 DIAGNOSIS — E782 Mixed hyperlipidemia: Secondary | ICD-10-CM | POA: Diagnosis not present

## 2019-11-15 DIAGNOSIS — E1169 Type 2 diabetes mellitus with other specified complication: Secondary | ICD-10-CM | POA: Diagnosis not present

## 2019-11-17 DIAGNOSIS — E1169 Type 2 diabetes mellitus with other specified complication: Secondary | ICD-10-CM | POA: Diagnosis not present

## 2019-11-17 DIAGNOSIS — Z23 Encounter for immunization: Secondary | ICD-10-CM | POA: Diagnosis not present

## 2019-11-17 DIAGNOSIS — K219 Gastro-esophageal reflux disease without esophagitis: Secondary | ICD-10-CM | POA: Diagnosis not present

## 2019-11-17 DIAGNOSIS — I10 Essential (primary) hypertension: Secondary | ICD-10-CM | POA: Diagnosis not present

## 2019-11-17 DIAGNOSIS — E782 Mixed hyperlipidemia: Secondary | ICD-10-CM | POA: Diagnosis not present

## 2019-11-17 DIAGNOSIS — R32 Unspecified urinary incontinence: Secondary | ICD-10-CM | POA: Diagnosis not present

## 2019-11-18 DIAGNOSIS — Z23 Encounter for immunization: Secondary | ICD-10-CM | POA: Diagnosis not present

## 2020-01-18 DIAGNOSIS — E782 Mixed hyperlipidemia: Secondary | ICD-10-CM | POA: Diagnosis not present

## 2020-01-18 DIAGNOSIS — R32 Unspecified urinary incontinence: Secondary | ICD-10-CM | POA: Diagnosis not present

## 2020-01-18 DIAGNOSIS — E7849 Other hyperlipidemia: Secondary | ICD-10-CM | POA: Diagnosis not present

## 2020-01-18 DIAGNOSIS — E1169 Type 2 diabetes mellitus with other specified complication: Secondary | ICD-10-CM | POA: Diagnosis not present

## 2020-01-18 DIAGNOSIS — Z86010 Personal history of colonic polyps: Secondary | ICD-10-CM | POA: Diagnosis not present

## 2020-01-18 DIAGNOSIS — K219 Gastro-esophageal reflux disease without esophagitis: Secondary | ICD-10-CM | POA: Diagnosis not present

## 2020-01-18 DIAGNOSIS — I10 Essential (primary) hypertension: Secondary | ICD-10-CM | POA: Diagnosis not present

## 2020-01-20 DIAGNOSIS — Z23 Encounter for immunization: Secondary | ICD-10-CM | POA: Diagnosis not present

## 2020-02-03 DIAGNOSIS — E782 Mixed hyperlipidemia: Secondary | ICD-10-CM | POA: Diagnosis not present

## 2020-02-03 DIAGNOSIS — E1169 Type 2 diabetes mellitus with other specified complication: Secondary | ICD-10-CM | POA: Diagnosis not present

## 2020-02-03 DIAGNOSIS — R32 Unspecified urinary incontinence: Secondary | ICD-10-CM | POA: Diagnosis not present

## 2020-02-03 DIAGNOSIS — K219 Gastro-esophageal reflux disease without esophagitis: Secondary | ICD-10-CM | POA: Diagnosis not present

## 2020-02-03 DIAGNOSIS — I10 Essential (primary) hypertension: Secondary | ICD-10-CM | POA: Diagnosis not present

## 2020-02-03 DIAGNOSIS — Z86010 Personal history of colonic polyps: Secondary | ICD-10-CM | POA: Diagnosis not present

## 2020-02-03 DIAGNOSIS — Z0001 Encounter for general adult medical examination with abnormal findings: Secondary | ICD-10-CM | POA: Diagnosis not present

## 2020-02-18 DIAGNOSIS — Z23 Encounter for immunization: Secondary | ICD-10-CM | POA: Diagnosis not present

## 2020-03-01 DIAGNOSIS — D1801 Hemangioma of skin and subcutaneous tissue: Secondary | ICD-10-CM | POA: Diagnosis not present

## 2020-03-01 DIAGNOSIS — L814 Other melanin hyperpigmentation: Secondary | ICD-10-CM | POA: Diagnosis not present

## 2020-03-01 DIAGNOSIS — L570 Actinic keratosis: Secondary | ICD-10-CM | POA: Diagnosis not present

## 2020-03-01 DIAGNOSIS — L821 Other seborrheic keratosis: Secondary | ICD-10-CM | POA: Diagnosis not present

## 2020-04-17 DIAGNOSIS — I10 Essential (primary) hypertension: Secondary | ICD-10-CM | POA: Diagnosis not present

## 2020-04-17 DIAGNOSIS — E782 Mixed hyperlipidemia: Secondary | ICD-10-CM | POA: Diagnosis not present

## 2020-04-17 DIAGNOSIS — E1169 Type 2 diabetes mellitus with other specified complication: Secondary | ICD-10-CM | POA: Diagnosis not present

## 2020-04-17 DIAGNOSIS — E7849 Other hyperlipidemia: Secondary | ICD-10-CM | POA: Diagnosis not present

## 2020-04-17 DIAGNOSIS — Z86010 Personal history of colonic polyps: Secondary | ICD-10-CM | POA: Diagnosis not present

## 2020-04-17 DIAGNOSIS — K219 Gastro-esophageal reflux disease without esophagitis: Secondary | ICD-10-CM | POA: Diagnosis not present

## 2020-04-17 DIAGNOSIS — R32 Unspecified urinary incontinence: Secondary | ICD-10-CM | POA: Diagnosis not present

## 2020-04-26 DIAGNOSIS — E1169 Type 2 diabetes mellitus with other specified complication: Secondary | ICD-10-CM | POA: Diagnosis not present

## 2020-04-26 DIAGNOSIS — Z0000 Encounter for general adult medical examination without abnormal findings: Secondary | ICD-10-CM | POA: Diagnosis not present

## 2020-04-26 DIAGNOSIS — E7849 Other hyperlipidemia: Secondary | ICD-10-CM | POA: Diagnosis not present

## 2020-04-26 DIAGNOSIS — Z86010 Personal history of colonic polyps: Secondary | ICD-10-CM | POA: Diagnosis not present

## 2020-04-26 DIAGNOSIS — I10 Essential (primary) hypertension: Secondary | ICD-10-CM | POA: Diagnosis not present

## 2020-04-26 DIAGNOSIS — R32 Unspecified urinary incontinence: Secondary | ICD-10-CM | POA: Diagnosis not present

## 2020-04-26 DIAGNOSIS — K219 Gastro-esophageal reflux disease without esophagitis: Secondary | ICD-10-CM | POA: Diagnosis not present

## 2020-04-26 DIAGNOSIS — Z6837 Body mass index (BMI) 37.0-37.9, adult: Secondary | ICD-10-CM | POA: Diagnosis not present

## 2020-04-26 DIAGNOSIS — Z0001 Encounter for general adult medical examination with abnormal findings: Secondary | ICD-10-CM | POA: Diagnosis not present

## 2020-04-26 DIAGNOSIS — E119 Type 2 diabetes mellitus without complications: Secondary | ICD-10-CM | POA: Diagnosis not present

## 2020-04-26 DIAGNOSIS — E782 Mixed hyperlipidemia: Secondary | ICD-10-CM | POA: Diagnosis not present

## 2020-05-01 DIAGNOSIS — E1169 Type 2 diabetes mellitus with other specified complication: Secondary | ICD-10-CM | POA: Diagnosis not present

## 2020-05-01 DIAGNOSIS — E782 Mixed hyperlipidemia: Secondary | ICD-10-CM | POA: Diagnosis not present

## 2020-05-01 DIAGNOSIS — Z23 Encounter for immunization: Secondary | ICD-10-CM | POA: Diagnosis not present

## 2020-05-01 DIAGNOSIS — Z0001 Encounter for general adult medical examination with abnormal findings: Secondary | ICD-10-CM | POA: Diagnosis not present

## 2020-05-01 DIAGNOSIS — R32 Unspecified urinary incontinence: Secondary | ICD-10-CM | POA: Diagnosis not present

## 2020-05-01 DIAGNOSIS — K219 Gastro-esophageal reflux disease without esophagitis: Secondary | ICD-10-CM | POA: Diagnosis not present

## 2020-05-01 DIAGNOSIS — I10 Essential (primary) hypertension: Secondary | ICD-10-CM | POA: Diagnosis not present

## 2020-07-17 DIAGNOSIS — I10 Essential (primary) hypertension: Secondary | ICD-10-CM | POA: Diagnosis not present

## 2020-07-17 DIAGNOSIS — E7849 Other hyperlipidemia: Secondary | ICD-10-CM | POA: Diagnosis not present

## 2020-07-17 DIAGNOSIS — K219 Gastro-esophageal reflux disease without esophagitis: Secondary | ICD-10-CM | POA: Diagnosis not present

## 2020-07-17 DIAGNOSIS — E1169 Type 2 diabetes mellitus with other specified complication: Secondary | ICD-10-CM | POA: Diagnosis not present

## 2020-07-17 DIAGNOSIS — Z86010 Personal history of colonic polyps: Secondary | ICD-10-CM | POA: Diagnosis not present

## 2020-07-17 DIAGNOSIS — R32 Unspecified urinary incontinence: Secondary | ICD-10-CM | POA: Diagnosis not present

## 2020-07-17 DIAGNOSIS — E782 Mixed hyperlipidemia: Secondary | ICD-10-CM | POA: Diagnosis not present

## 2020-08-22 DIAGNOSIS — K219 Gastro-esophageal reflux disease without esophagitis: Secondary | ICD-10-CM | POA: Diagnosis not present

## 2020-08-22 DIAGNOSIS — I10 Essential (primary) hypertension: Secondary | ICD-10-CM | POA: Diagnosis not present

## 2020-08-22 DIAGNOSIS — Z86010 Personal history of colonic polyps: Secondary | ICD-10-CM | POA: Diagnosis not present

## 2020-08-22 DIAGNOSIS — E782 Mixed hyperlipidemia: Secondary | ICD-10-CM | POA: Diagnosis not present

## 2020-08-22 DIAGNOSIS — R32 Unspecified urinary incontinence: Secondary | ICD-10-CM | POA: Diagnosis not present

## 2020-08-22 DIAGNOSIS — E7849 Other hyperlipidemia: Secondary | ICD-10-CM | POA: Diagnosis not present

## 2020-08-22 DIAGNOSIS — E1169 Type 2 diabetes mellitus with other specified complication: Secondary | ICD-10-CM | POA: Diagnosis not present

## 2020-09-01 ENCOUNTER — Other Ambulatory Visit (HOSPITAL_COMMUNITY): Payer: Self-pay | Admitting: Internal Medicine

## 2020-09-01 DIAGNOSIS — Z1231 Encounter for screening mammogram for malignant neoplasm of breast: Secondary | ICD-10-CM

## 2020-09-06 ENCOUNTER — Ambulatory Visit (HOSPITAL_COMMUNITY)
Admission: RE | Admit: 2020-09-06 | Discharge: 2020-09-06 | Disposition: A | Discharge status: Home / Self Care | Payer: Medicare Other | Source: Ambulatory Visit | Attending: Internal Medicine | Admitting: Internal Medicine

## 2020-09-06 ENCOUNTER — Other Ambulatory Visit: Payer: Self-pay

## 2020-09-06 DIAGNOSIS — Z1231 Encounter for screening mammogram for malignant neoplasm of breast: Secondary | ICD-10-CM | POA: Insufficient documentation

## 2020-09-20 DIAGNOSIS — R32 Unspecified urinary incontinence: Secondary | ICD-10-CM | POA: Diagnosis not present

## 2020-09-20 DIAGNOSIS — E782 Mixed hyperlipidemia: Secondary | ICD-10-CM | POA: Diagnosis not present

## 2020-09-20 DIAGNOSIS — I10 Essential (primary) hypertension: Secondary | ICD-10-CM | POA: Diagnosis not present

## 2020-09-20 DIAGNOSIS — K219 Gastro-esophageal reflux disease without esophagitis: Secondary | ICD-10-CM | POA: Diagnosis not present

## 2020-09-20 DIAGNOSIS — Z86010 Personal history of colonic polyps: Secondary | ICD-10-CM | POA: Diagnosis not present

## 2020-09-20 DIAGNOSIS — E1169 Type 2 diabetes mellitus with other specified complication: Secondary | ICD-10-CM | POA: Diagnosis not present

## 2020-09-20 DIAGNOSIS — E7849 Other hyperlipidemia: Secondary | ICD-10-CM | POA: Diagnosis not present

## 2020-10-19 DIAGNOSIS — Z23 Encounter for immunization: Secondary | ICD-10-CM | POA: Diagnosis not present

## 2020-10-25 DIAGNOSIS — Z86010 Personal history of colonic polyps: Secondary | ICD-10-CM | POA: Diagnosis not present

## 2020-10-25 DIAGNOSIS — E1169 Type 2 diabetes mellitus with other specified complication: Secondary | ICD-10-CM | POA: Diagnosis not present

## 2020-10-25 DIAGNOSIS — K219 Gastro-esophageal reflux disease without esophagitis: Secondary | ICD-10-CM | POA: Diagnosis not present

## 2020-10-25 DIAGNOSIS — R32 Unspecified urinary incontinence: Secondary | ICD-10-CM | POA: Diagnosis not present

## 2020-10-25 DIAGNOSIS — I10 Essential (primary) hypertension: Secondary | ICD-10-CM | POA: Diagnosis not present

## 2020-10-25 DIAGNOSIS — E782 Mixed hyperlipidemia: Secondary | ICD-10-CM | POA: Diagnosis not present

## 2020-10-25 DIAGNOSIS — E7849 Other hyperlipidemia: Secondary | ICD-10-CM | POA: Diagnosis not present

## 2020-11-01 DIAGNOSIS — E782 Mixed hyperlipidemia: Secondary | ICD-10-CM | POA: Diagnosis not present

## 2020-11-01 DIAGNOSIS — Z0000 Encounter for general adult medical examination without abnormal findings: Secondary | ICD-10-CM | POA: Diagnosis not present

## 2020-11-01 DIAGNOSIS — E119 Type 2 diabetes mellitus without complications: Secondary | ICD-10-CM | POA: Diagnosis not present

## 2020-11-01 DIAGNOSIS — K219 Gastro-esophageal reflux disease without esophagitis: Secondary | ICD-10-CM | POA: Diagnosis not present

## 2020-11-01 DIAGNOSIS — E1169 Type 2 diabetes mellitus with other specified complication: Secondary | ICD-10-CM | POA: Diagnosis not present

## 2020-11-01 DIAGNOSIS — I10 Essential (primary) hypertension: Secondary | ICD-10-CM | POA: Diagnosis not present

## 2020-11-01 DIAGNOSIS — R32 Unspecified urinary incontinence: Secondary | ICD-10-CM | POA: Diagnosis not present

## 2020-11-01 DIAGNOSIS — Z712 Person consulting for explanation of examination or test findings: Secondary | ICD-10-CM | POA: Diagnosis not present

## 2020-11-01 DIAGNOSIS — Z86010 Personal history of colonic polyps: Secondary | ICD-10-CM | POA: Diagnosis not present

## 2020-11-01 DIAGNOSIS — Z6837 Body mass index (BMI) 37.0-37.9, adult: Secondary | ICD-10-CM | POA: Diagnosis not present

## 2020-11-01 DIAGNOSIS — E7849 Other hyperlipidemia: Secondary | ICD-10-CM | POA: Diagnosis not present

## 2020-11-06 DIAGNOSIS — I10 Essential (primary) hypertension: Secondary | ICD-10-CM | POA: Diagnosis not present

## 2020-11-06 DIAGNOSIS — K219 Gastro-esophageal reflux disease without esophagitis: Secondary | ICD-10-CM | POA: Diagnosis not present

## 2020-11-06 DIAGNOSIS — Z23 Encounter for immunization: Secondary | ICD-10-CM | POA: Diagnosis not present

## 2020-11-06 DIAGNOSIS — E1169 Type 2 diabetes mellitus with other specified complication: Secondary | ICD-10-CM | POA: Diagnosis not present

## 2020-11-06 DIAGNOSIS — R32 Unspecified urinary incontinence: Secondary | ICD-10-CM | POA: Diagnosis not present

## 2020-11-06 DIAGNOSIS — E782 Mixed hyperlipidemia: Secondary | ICD-10-CM | POA: Diagnosis not present

## 2020-12-15 DIAGNOSIS — I10 Essential (primary) hypertension: Secondary | ICD-10-CM | POA: Diagnosis not present

## 2020-12-15 DIAGNOSIS — Z23 Encounter for immunization: Secondary | ICD-10-CM | POA: Diagnosis not present

## 2020-12-15 DIAGNOSIS — E782 Mixed hyperlipidemia: Secondary | ICD-10-CM | POA: Diagnosis not present

## 2020-12-15 DIAGNOSIS — R32 Unspecified urinary incontinence: Secondary | ICD-10-CM | POA: Diagnosis not present

## 2020-12-15 DIAGNOSIS — E1169 Type 2 diabetes mellitus with other specified complication: Secondary | ICD-10-CM | POA: Diagnosis not present

## 2020-12-15 DIAGNOSIS — K219 Gastro-esophageal reflux disease without esophagitis: Secondary | ICD-10-CM | POA: Diagnosis not present

## 2021-01-04 DIAGNOSIS — E1136 Type 2 diabetes mellitus with diabetic cataract: Secondary | ICD-10-CM | POA: Diagnosis not present

## 2021-01-04 DIAGNOSIS — H2513 Age-related nuclear cataract, bilateral: Secondary | ICD-10-CM | POA: Diagnosis not present

## 2021-02-12 DIAGNOSIS — K219 Gastro-esophageal reflux disease without esophagitis: Secondary | ICD-10-CM | POA: Diagnosis not present

## 2021-02-12 DIAGNOSIS — E782 Mixed hyperlipidemia: Secondary | ICD-10-CM | POA: Diagnosis not present

## 2021-02-12 DIAGNOSIS — I10 Essential (primary) hypertension: Secondary | ICD-10-CM | POA: Diagnosis not present

## 2021-02-12 DIAGNOSIS — R32 Unspecified urinary incontinence: Secondary | ICD-10-CM | POA: Diagnosis not present

## 2021-02-12 DIAGNOSIS — E1169 Type 2 diabetes mellitus with other specified complication: Secondary | ICD-10-CM | POA: Diagnosis not present

## 2021-03-14 DIAGNOSIS — K219 Gastro-esophageal reflux disease without esophagitis: Secondary | ICD-10-CM | POA: Diagnosis not present

## 2021-03-14 DIAGNOSIS — L309 Dermatitis, unspecified: Secondary | ICD-10-CM | POA: Diagnosis not present

## 2021-03-14 DIAGNOSIS — L821 Other seborrheic keratosis: Secondary | ICD-10-CM | POA: Diagnosis not present

## 2021-03-14 DIAGNOSIS — I10 Essential (primary) hypertension: Secondary | ICD-10-CM | POA: Diagnosis not present

## 2021-03-14 DIAGNOSIS — L570 Actinic keratosis: Secondary | ICD-10-CM | POA: Diagnosis not present

## 2021-03-14 DIAGNOSIS — R32 Unspecified urinary incontinence: Secondary | ICD-10-CM | POA: Diagnosis not present

## 2021-03-14 DIAGNOSIS — E1169 Type 2 diabetes mellitus with other specified complication: Secondary | ICD-10-CM | POA: Diagnosis not present

## 2021-03-14 DIAGNOSIS — E782 Mixed hyperlipidemia: Secondary | ICD-10-CM | POA: Diagnosis not present

## 2021-03-21 DIAGNOSIS — I10 Essential (primary) hypertension: Secondary | ICD-10-CM | POA: Diagnosis not present

## 2021-03-21 DIAGNOSIS — E1169 Type 2 diabetes mellitus with other specified complication: Secondary | ICD-10-CM | POA: Diagnosis not present

## 2021-03-21 DIAGNOSIS — E7849 Other hyperlipidemia: Secondary | ICD-10-CM | POA: Diagnosis not present

## 2021-03-21 DIAGNOSIS — E782 Mixed hyperlipidemia: Secondary | ICD-10-CM | POA: Diagnosis not present

## 2021-03-21 DIAGNOSIS — Z6837 Body mass index (BMI) 37.0-37.9, adult: Secondary | ICD-10-CM | POA: Diagnosis not present

## 2021-03-27 DIAGNOSIS — Z23 Encounter for immunization: Secondary | ICD-10-CM | POA: Diagnosis not present

## 2021-03-28 DIAGNOSIS — E782 Mixed hyperlipidemia: Secondary | ICD-10-CM | POA: Diagnosis not present

## 2021-03-28 DIAGNOSIS — Z712 Person consulting for explanation of examination or test findings: Secondary | ICD-10-CM | POA: Diagnosis not present

## 2021-03-28 DIAGNOSIS — E1169 Type 2 diabetes mellitus with other specified complication: Secondary | ICD-10-CM | POA: Diagnosis not present

## 2021-03-28 DIAGNOSIS — R32 Unspecified urinary incontinence: Secondary | ICD-10-CM | POA: Diagnosis not present

## 2021-03-28 DIAGNOSIS — K219 Gastro-esophageal reflux disease without esophagitis: Secondary | ICD-10-CM | POA: Diagnosis not present

## 2021-03-28 DIAGNOSIS — I10 Essential (primary) hypertension: Secondary | ICD-10-CM | POA: Diagnosis not present

## 2021-05-16 IMAGING — MG DIGITAL SCREENING BILATERAL MAMMOGRAM WITH TOMO AND CAD
8 series · 8 of 24 positions shown · non-contrast
Comparison: Previous exam(s).

CLINICAL DATA: Screening.

EXAM:
DIGITAL SCREENING BILATERAL MAMMOGRAM WITH TOMO AND CAD

[L MLO synth-2D]
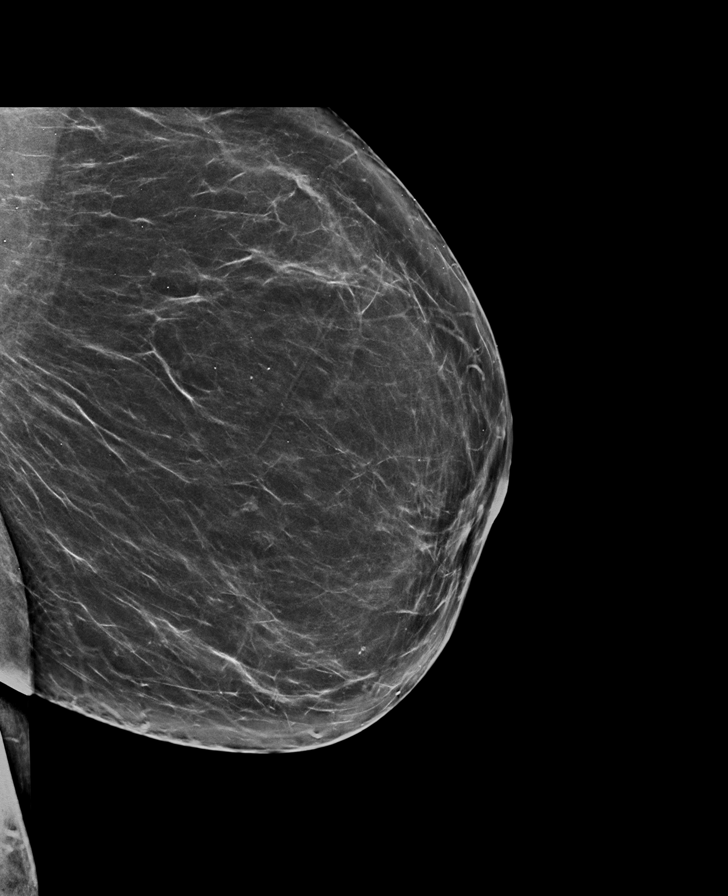

[R MLO synth-2D]
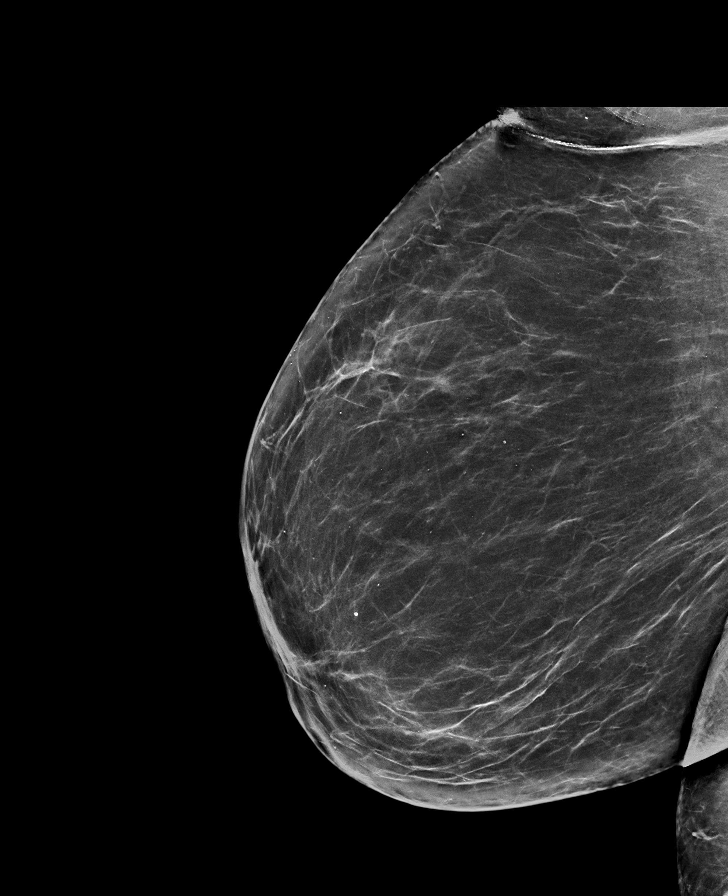

[R CC synth-2D]
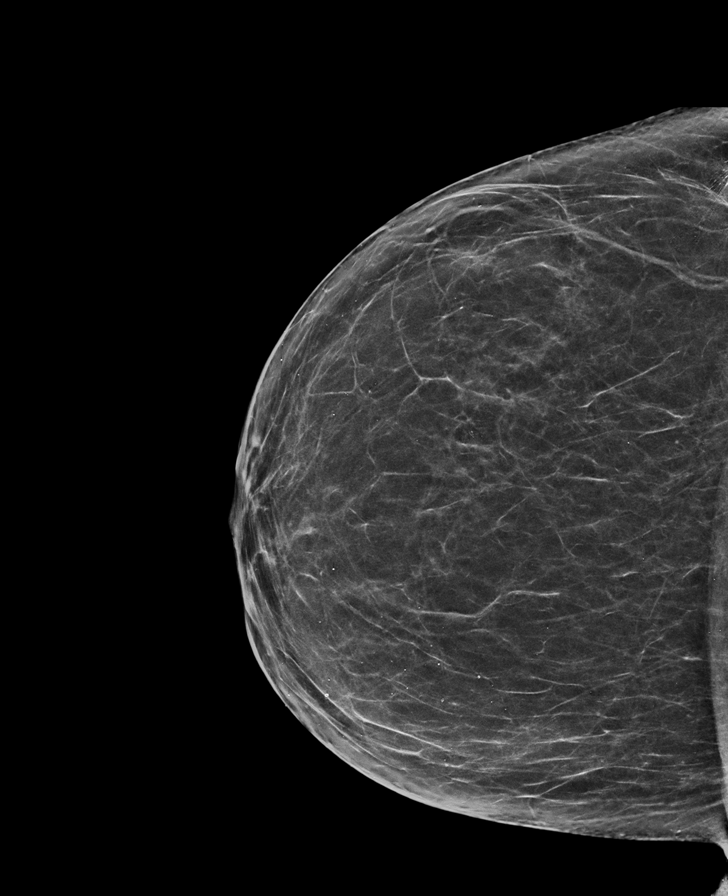

[L CC synth-2D]
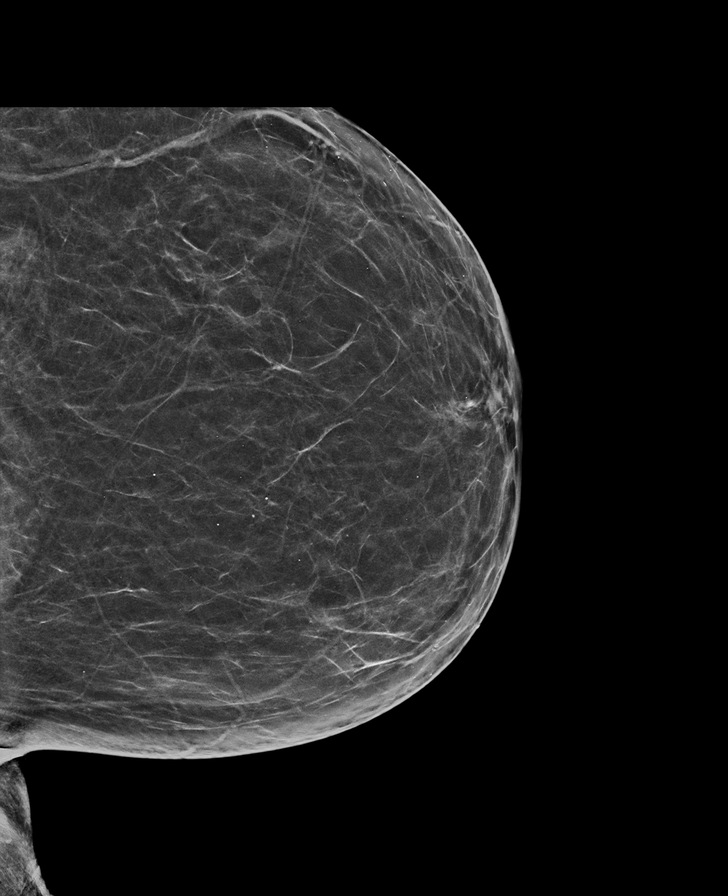

[R MLO tomo · tomo slice 39/78.0]
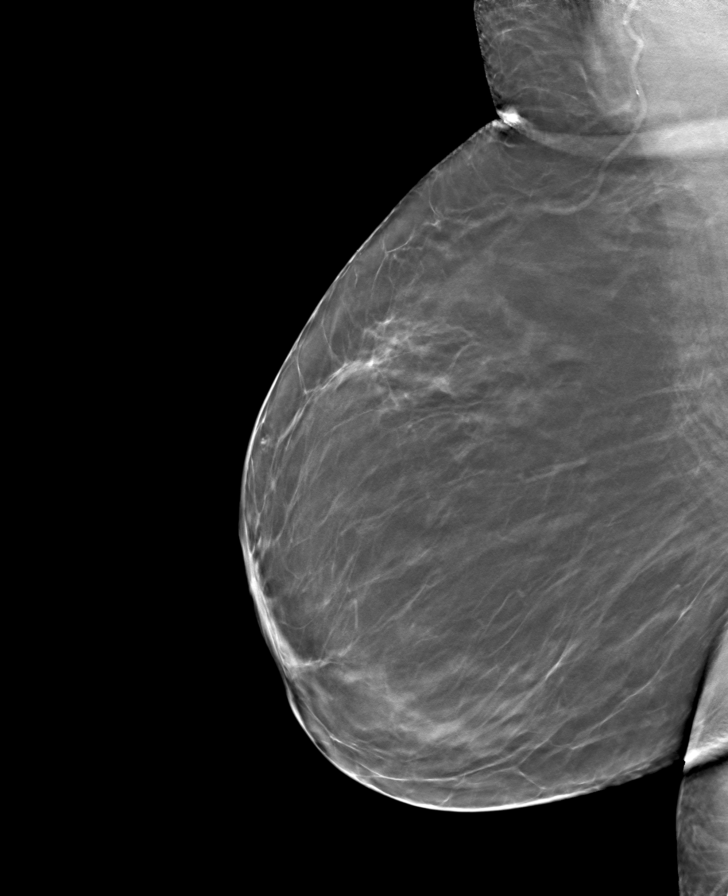

[L CC tomo · tomo slice 36/71.0]
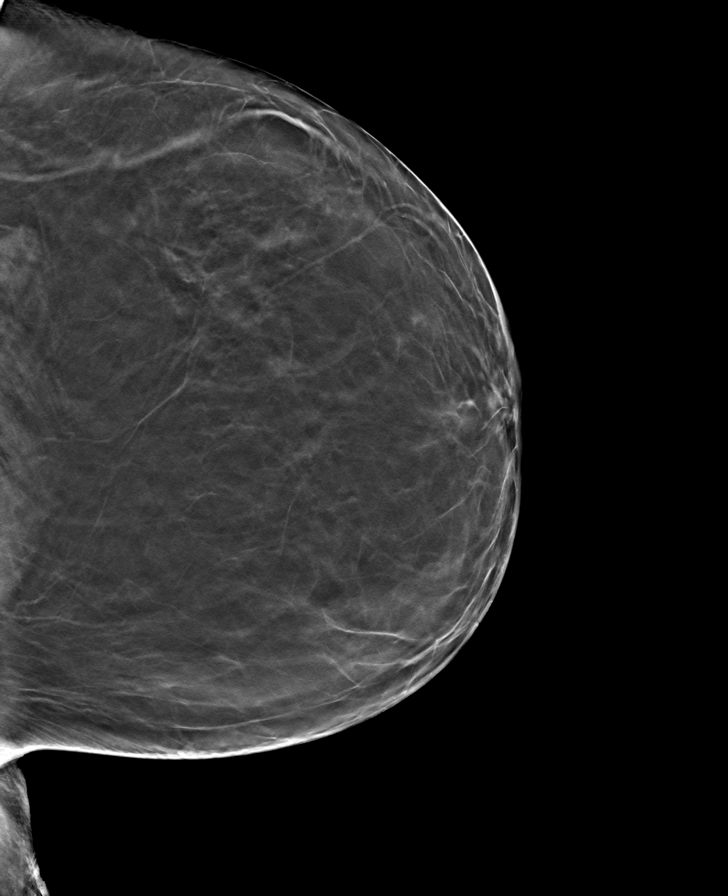

[R CC tomo · tomo slice 35/69.0]
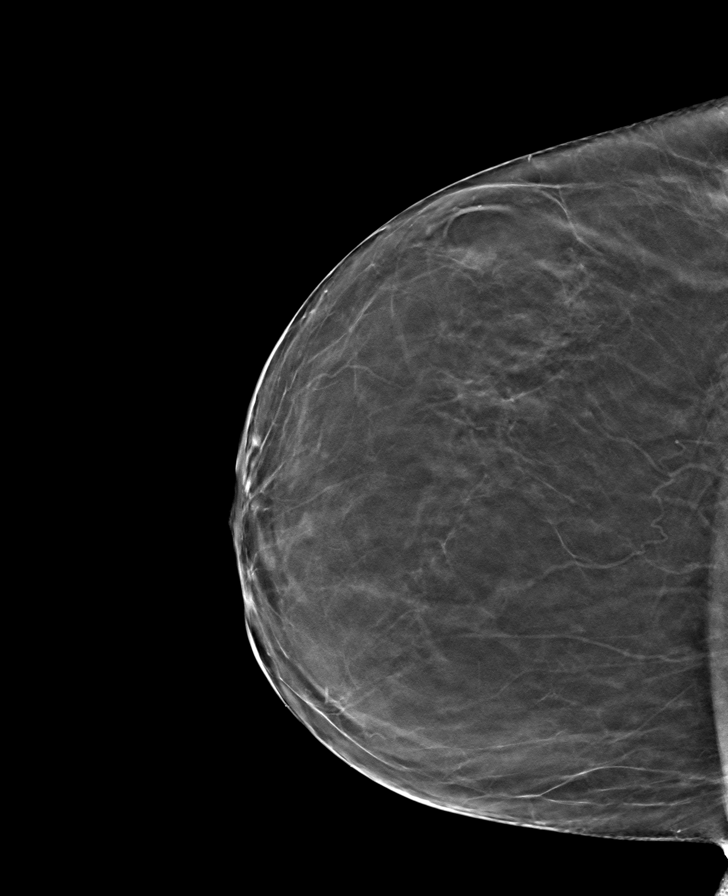

[L MLO tomo · tomo slice 39/78.0]
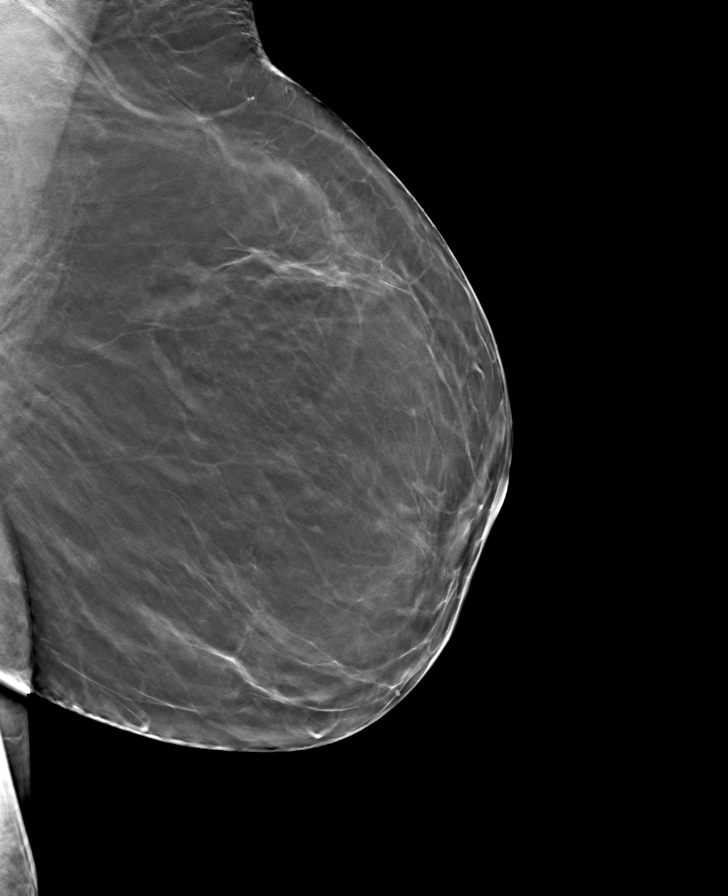

[8 of 24 positions shown; findings below may reference images not displayed]

ACR Breast Density Category b: There are scattered areas of
fibroglandular density.
FINDINGS: There are no findings suspicious for malignancy. Images were
processed with CAD.
IMPRESSION: No mammographic evidence of malignancy. A result letter of this
screening mammogram will be mailed directly to the patient.

RECOMMENDATION:
Screening mammogram in one year. (Code:[id number])

BI-RADS CATEGORY  1: Negative.

## 2021-06-16 DIAGNOSIS — Z20822 Contact with and (suspected) exposure to covid-19: Secondary | ICD-10-CM | POA: Diagnosis not present

## 2021-08-15 DIAGNOSIS — I10 Essential (primary) hypertension: Secondary | ICD-10-CM | POA: Diagnosis not present

## 2021-08-15 DIAGNOSIS — K219 Gastro-esophageal reflux disease without esophagitis: Secondary | ICD-10-CM | POA: Diagnosis not present

## 2021-09-27 DIAGNOSIS — E119 Type 2 diabetes mellitus without complications: Secondary | ICD-10-CM | POA: Diagnosis not present

## 2021-09-27 DIAGNOSIS — I10 Essential (primary) hypertension: Secondary | ICD-10-CM | POA: Diagnosis not present

## 2021-10-03 DIAGNOSIS — I10 Essential (primary) hypertension: Secondary | ICD-10-CM | POA: Diagnosis not present

## 2021-10-03 DIAGNOSIS — Z23 Encounter for immunization: Secondary | ICD-10-CM | POA: Diagnosis not present

## 2021-10-03 DIAGNOSIS — R32 Unspecified urinary incontinence: Secondary | ICD-10-CM | POA: Diagnosis not present

## 2021-10-03 DIAGNOSIS — K219 Gastro-esophageal reflux disease without esophagitis: Secondary | ICD-10-CM | POA: Diagnosis not present

## 2021-10-03 DIAGNOSIS — E118 Type 2 diabetes mellitus with unspecified complications: Secondary | ICD-10-CM | POA: Diagnosis not present

## 2021-10-03 DIAGNOSIS — Z0001 Encounter for general adult medical examination with abnormal findings: Secondary | ICD-10-CM | POA: Diagnosis not present

## 2021-10-03 DIAGNOSIS — E559 Vitamin D deficiency, unspecified: Secondary | ICD-10-CM | POA: Diagnosis not present

## 2021-10-03 DIAGNOSIS — Z86010 Personal history of colonic polyps: Secondary | ICD-10-CM | POA: Diagnosis not present

## 2021-10-03 DIAGNOSIS — E782 Mixed hyperlipidemia: Secondary | ICD-10-CM | POA: Diagnosis not present

## 2021-10-05 ENCOUNTER — Encounter: Payer: Self-pay | Admitting: *Deleted

## 2021-10-12 ENCOUNTER — Other Ambulatory Visit (HOSPITAL_COMMUNITY): Payer: Self-pay | Admitting: Internal Medicine

## 2021-10-12 DIAGNOSIS — Z1231 Encounter for screening mammogram for malignant neoplasm of breast: Secondary | ICD-10-CM

## 2021-10-15 DIAGNOSIS — I10 Essential (primary) hypertension: Secondary | ICD-10-CM | POA: Diagnosis not present

## 2021-10-15 DIAGNOSIS — K219 Gastro-esophageal reflux disease without esophagitis: Secondary | ICD-10-CM | POA: Diagnosis not present

## 2021-10-24 ENCOUNTER — Ambulatory Visit (HOSPITAL_COMMUNITY)
Admission: RE | Admit: 2021-10-24 | Discharge: 2021-10-24 | Disposition: A | Discharge status: Home / Self Care | Payer: Medicare Other | Source: Ambulatory Visit | Attending: Internal Medicine | Admitting: Internal Medicine

## 2021-10-24 ENCOUNTER — Other Ambulatory Visit: Payer: Self-pay

## 2021-10-24 DIAGNOSIS — Z1231 Encounter for screening mammogram for malignant neoplasm of breast: Secondary | ICD-10-CM | POA: Diagnosis not present

## 2021-12-19 DIAGNOSIS — J019 Acute sinusitis, unspecified: Secondary | ICD-10-CM | POA: Diagnosis not present

## 2021-12-21 DIAGNOSIS — Z20822 Contact with and (suspected) exposure to covid-19: Secondary | ICD-10-CM | POA: Diagnosis not present

## 2022-01-07 DIAGNOSIS — H2513 Age-related nuclear cataract, bilateral: Secondary | ICD-10-CM | POA: Diagnosis not present

## 2022-01-07 DIAGNOSIS — E1136 Type 2 diabetes mellitus with diabetic cataract: Secondary | ICD-10-CM | POA: Diagnosis not present

## 2022-01-15 DIAGNOSIS — I10 Essential (primary) hypertension: Secondary | ICD-10-CM | POA: Diagnosis not present

## 2022-01-15 DIAGNOSIS — E782 Mixed hyperlipidemia: Secondary | ICD-10-CM | POA: Diagnosis not present

## 2022-01-31 DIAGNOSIS — E782 Mixed hyperlipidemia: Secondary | ICD-10-CM | POA: Diagnosis not present

## 2022-01-31 DIAGNOSIS — E559 Vitamin D deficiency, unspecified: Secondary | ICD-10-CM | POA: Diagnosis not present

## 2022-01-31 DIAGNOSIS — E118 Type 2 diabetes mellitus with unspecified complications: Secondary | ICD-10-CM | POA: Diagnosis not present

## 2022-02-06 DIAGNOSIS — K219 Gastro-esophageal reflux disease without esophagitis: Secondary | ICD-10-CM | POA: Diagnosis not present

## 2022-02-06 DIAGNOSIS — I10 Essential (primary) hypertension: Secondary | ICD-10-CM | POA: Diagnosis not present

## 2022-02-06 DIAGNOSIS — Z6836 Body mass index (BMI) 36.0-36.9, adult: Secondary | ICD-10-CM | POA: Diagnosis not present

## 2022-02-06 DIAGNOSIS — Z86010 Personal history of colonic polyps: Secondary | ICD-10-CM | POA: Diagnosis not present

## 2022-02-06 DIAGNOSIS — E782 Mixed hyperlipidemia: Secondary | ICD-10-CM | POA: Diagnosis not present

## 2022-02-06 DIAGNOSIS — E118 Type 2 diabetes mellitus with unspecified complications: Secondary | ICD-10-CM | POA: Diagnosis not present

## 2022-02-06 DIAGNOSIS — E559 Vitamin D deficiency, unspecified: Secondary | ICD-10-CM | POA: Diagnosis not present

## 2022-02-06 DIAGNOSIS — R32 Unspecified urinary incontinence: Secondary | ICD-10-CM | POA: Diagnosis not present

## 2022-02-27 DIAGNOSIS — Z20822 Contact with and (suspected) exposure to covid-19: Secondary | ICD-10-CM | POA: Diagnosis not present

## 2022-03-13 DIAGNOSIS — L570 Actinic keratosis: Secondary | ICD-10-CM | POA: Diagnosis not present

## 2022-03-13 DIAGNOSIS — D239 Other benign neoplasm of skin, unspecified: Secondary | ICD-10-CM | POA: Diagnosis not present

## 2022-03-13 DIAGNOSIS — Z1283 Encounter for screening for malignant neoplasm of skin: Secondary | ICD-10-CM | POA: Diagnosis not present

## 2022-04-01 DIAGNOSIS — Z20822 Contact with and (suspected) exposure to covid-19: Secondary | ICD-10-CM | POA: Diagnosis not present

## 2022-04-02 DIAGNOSIS — Z20822 Contact with and (suspected) exposure to covid-19: Secondary | ICD-10-CM | POA: Diagnosis not present

## 2022-04-11 DIAGNOSIS — Z20822 Contact with and (suspected) exposure to covid-19: Secondary | ICD-10-CM | POA: Diagnosis not present

## 2022-04-11 DIAGNOSIS — R059 Cough, unspecified: Secondary | ICD-10-CM | POA: Diagnosis not present

## 2022-04-11 DIAGNOSIS — R051 Acute cough: Secondary | ICD-10-CM | POA: Diagnosis not present

## 2022-04-20 DIAGNOSIS — Z20822 Contact with and (suspected) exposure to covid-19: Secondary | ICD-10-CM | POA: Diagnosis not present

## 2022-04-22 DIAGNOSIS — Z20822 Contact with and (suspected) exposure to covid-19: Secondary | ICD-10-CM | POA: Diagnosis not present

## 2022-05-15 DIAGNOSIS — K219 Gastro-esophageal reflux disease without esophagitis: Secondary | ICD-10-CM | POA: Diagnosis not present

## 2022-05-15 DIAGNOSIS — I10 Essential (primary) hypertension: Secondary | ICD-10-CM | POA: Diagnosis not present

## 2022-05-15 DIAGNOSIS — E782 Mixed hyperlipidemia: Secondary | ICD-10-CM | POA: Diagnosis not present

## 2022-05-15 DIAGNOSIS — E118 Type 2 diabetes mellitus with unspecified complications: Secondary | ICD-10-CM | POA: Diagnosis not present

## 2022-05-31 DIAGNOSIS — E782 Mixed hyperlipidemia: Secondary | ICD-10-CM | POA: Diagnosis not present

## 2022-05-31 DIAGNOSIS — E118 Type 2 diabetes mellitus with unspecified complications: Secondary | ICD-10-CM | POA: Diagnosis not present

## 2022-05-31 DIAGNOSIS — E559 Vitamin D deficiency, unspecified: Secondary | ICD-10-CM | POA: Diagnosis not present

## 2022-06-04 DIAGNOSIS — E118 Type 2 diabetes mellitus with unspecified complications: Secondary | ICD-10-CM | POA: Diagnosis not present

## 2022-06-04 DIAGNOSIS — K219 Gastro-esophageal reflux disease without esophagitis: Secondary | ICD-10-CM | POA: Diagnosis not present

## 2022-06-04 DIAGNOSIS — R32 Unspecified urinary incontinence: Secondary | ICD-10-CM | POA: Diagnosis not present

## 2022-06-04 DIAGNOSIS — Z86010 Personal history of colonic polyps: Secondary | ICD-10-CM | POA: Diagnosis not present

## 2022-06-04 DIAGNOSIS — E559 Vitamin D deficiency, unspecified: Secondary | ICD-10-CM | POA: Diagnosis not present

## 2022-06-04 DIAGNOSIS — I10 Essential (primary) hypertension: Secondary | ICD-10-CM | POA: Diagnosis not present

## 2022-06-04 DIAGNOSIS — E782 Mixed hyperlipidemia: Secondary | ICD-10-CM | POA: Diagnosis not present

## 2022-06-04 DIAGNOSIS — Z6836 Body mass index (BMI) 36.0-36.9, adult: Secondary | ICD-10-CM | POA: Diagnosis not present

## 2022-11-12 DIAGNOSIS — Z23 Encounter for immunization: Secondary | ICD-10-CM | POA: Diagnosis not present

## 2022-11-27 DIAGNOSIS — I10 Essential (primary) hypertension: Secondary | ICD-10-CM | POA: Diagnosis not present

## 2022-11-27 DIAGNOSIS — E782 Mixed hyperlipidemia: Secondary | ICD-10-CM | POA: Diagnosis not present

## 2022-11-27 DIAGNOSIS — E118 Type 2 diabetes mellitus with unspecified complications: Secondary | ICD-10-CM | POA: Diagnosis not present

## 2022-11-27 DIAGNOSIS — E559 Vitamin D deficiency, unspecified: Secondary | ICD-10-CM | POA: Diagnosis not present

## 2022-12-06 ENCOUNTER — Other Ambulatory Visit (HOSPITAL_COMMUNITY): Payer: Self-pay | Admitting: Internal Medicine

## 2022-12-06 DIAGNOSIS — Z1231 Encounter for screening mammogram for malignant neoplasm of breast: Secondary | ICD-10-CM

## 2022-12-12 ENCOUNTER — Ambulatory Visit (HOSPITAL_COMMUNITY)
Admission: RE | Admit: 2022-12-12 | Discharge: 2022-12-12 | Disposition: A | Discharge status: Home / Self Care | Payer: Medicare Other | Source: Ambulatory Visit | Attending: Internal Medicine | Admitting: Internal Medicine

## 2022-12-12 DIAGNOSIS — Z1231 Encounter for screening mammogram for malignant neoplasm of breast: Secondary | ICD-10-CM | POA: Insufficient documentation

## 2022-12-13 DIAGNOSIS — E782 Mixed hyperlipidemia: Secondary | ICD-10-CM | POA: Diagnosis not present

## 2022-12-13 DIAGNOSIS — E876 Hypokalemia: Secondary | ICD-10-CM | POA: Diagnosis not present

## 2022-12-13 DIAGNOSIS — M25552 Pain in left hip: Secondary | ICD-10-CM | POA: Diagnosis not present

## 2022-12-13 DIAGNOSIS — N3281 Overactive bladder: Secondary | ICD-10-CM | POA: Diagnosis not present

## 2022-12-13 DIAGNOSIS — Z0001 Encounter for general adult medical examination with abnormal findings: Secondary | ICD-10-CM | POA: Diagnosis not present

## 2022-12-13 DIAGNOSIS — M25562 Pain in left knee: Secondary | ICD-10-CM | POA: Diagnosis not present

## 2022-12-13 DIAGNOSIS — I10 Essential (primary) hypertension: Secondary | ICD-10-CM | POA: Diagnosis not present

## 2022-12-13 DIAGNOSIS — E118 Type 2 diabetes mellitus with unspecified complications: Secondary | ICD-10-CM | POA: Diagnosis not present

## 2022-12-13 DIAGNOSIS — K219 Gastro-esophageal reflux disease without esophagitis: Secondary | ICD-10-CM | POA: Diagnosis not present

## 2022-12-13 DIAGNOSIS — Z86010 Personal history of colonic polyps: Secondary | ICD-10-CM | POA: Diagnosis not present

## 2022-12-13 DIAGNOSIS — E119 Type 2 diabetes mellitus without complications: Secondary | ICD-10-CM | POA: Diagnosis not present

## 2022-12-26 DIAGNOSIS — M170 Bilateral primary osteoarthritis of knee: Secondary | ICD-10-CM | POA: Diagnosis not present

## 2022-12-26 DIAGNOSIS — M1712 Unilateral primary osteoarthritis, left knee: Secondary | ICD-10-CM | POA: Diagnosis not present

## 2023-01-02 DIAGNOSIS — M1712 Unilateral primary osteoarthritis, left knee: Secondary | ICD-10-CM | POA: Diagnosis not present

## 2023-01-20 DIAGNOSIS — M1732 Unilateral post-traumatic osteoarthritis, left knee: Secondary | ICD-10-CM | POA: Diagnosis not present

## 2023-01-20 DIAGNOSIS — R262 Difficulty in walking, not elsewhere classified: Secondary | ICD-10-CM | POA: Diagnosis not present

## 2023-01-20 DIAGNOSIS — M25662 Stiffness of left knee, not elsewhere classified: Secondary | ICD-10-CM | POA: Diagnosis not present

## 2023-02-05 DIAGNOSIS — M1712 Unilateral primary osteoarthritis, left knee: Secondary | ICD-10-CM | POA: Diagnosis not present

## 2023-02-14 DIAGNOSIS — G8918 Other acute postprocedural pain: Secondary | ICD-10-CM | POA: Diagnosis not present

## 2023-02-14 DIAGNOSIS — Z96652 Presence of left artificial knee joint: Secondary | ICD-10-CM | POA: Diagnosis not present

## 2023-02-14 DIAGNOSIS — M1712 Unilateral primary osteoarthritis, left knee: Secondary | ICD-10-CM | POA: Diagnosis not present

## 2023-02-14 DIAGNOSIS — M21062 Valgus deformity, not elsewhere classified, left knee: Secondary | ICD-10-CM | POA: Diagnosis not present

## 2023-02-15 DIAGNOSIS — I10 Essential (primary) hypertension: Secondary | ICD-10-CM | POA: Diagnosis not present

## 2023-02-15 DIAGNOSIS — Z7982 Long term (current) use of aspirin: Secondary | ICD-10-CM | POA: Diagnosis not present

## 2023-02-15 DIAGNOSIS — Z471 Aftercare following joint replacement surgery: Secondary | ICD-10-CM | POA: Diagnosis not present

## 2023-02-15 DIAGNOSIS — Z791 Long term (current) use of non-steroidal anti-inflammatories (NSAID): Secondary | ICD-10-CM | POA: Diagnosis not present

## 2023-02-15 DIAGNOSIS — Z9181 History of falling: Secondary | ICD-10-CM | POA: Diagnosis not present

## 2023-02-15 DIAGNOSIS — Z96652 Presence of left artificial knee joint: Secondary | ICD-10-CM | POA: Diagnosis not present

## 2023-02-15 DIAGNOSIS — K219 Gastro-esophageal reflux disease without esophagitis: Secondary | ICD-10-CM | POA: Diagnosis not present

## 2023-02-15 DIAGNOSIS — Z7984 Long term (current) use of oral hypoglycemic drugs: Secondary | ICD-10-CM | POA: Diagnosis not present

## 2023-02-15 DIAGNOSIS — E119 Type 2 diabetes mellitus without complications: Secondary | ICD-10-CM | POA: Diagnosis not present

## 2023-02-15 DIAGNOSIS — E785 Hyperlipidemia, unspecified: Secondary | ICD-10-CM | POA: Diagnosis not present

## 2023-02-17 DIAGNOSIS — Z96652 Presence of left artificial knee joint: Secondary | ICD-10-CM | POA: Diagnosis not present

## 2023-02-17 DIAGNOSIS — K219 Gastro-esophageal reflux disease without esophagitis: Secondary | ICD-10-CM | POA: Diagnosis not present

## 2023-02-17 DIAGNOSIS — E785 Hyperlipidemia, unspecified: Secondary | ICD-10-CM | POA: Diagnosis not present

## 2023-02-17 DIAGNOSIS — Z471 Aftercare following joint replacement surgery: Secondary | ICD-10-CM | POA: Diagnosis not present

## 2023-02-17 DIAGNOSIS — E119 Type 2 diabetes mellitus without complications: Secondary | ICD-10-CM | POA: Diagnosis not present

## 2023-02-17 DIAGNOSIS — I10 Essential (primary) hypertension: Secondary | ICD-10-CM | POA: Diagnosis not present

## 2023-02-19 DIAGNOSIS — K219 Gastro-esophageal reflux disease without esophagitis: Secondary | ICD-10-CM | POA: Diagnosis not present

## 2023-02-19 DIAGNOSIS — Z96652 Presence of left artificial knee joint: Secondary | ICD-10-CM | POA: Diagnosis not present

## 2023-02-19 DIAGNOSIS — E119 Type 2 diabetes mellitus without complications: Secondary | ICD-10-CM | POA: Diagnosis not present

## 2023-02-19 DIAGNOSIS — Z471 Aftercare following joint replacement surgery: Secondary | ICD-10-CM | POA: Diagnosis not present

## 2023-02-19 DIAGNOSIS — I10 Essential (primary) hypertension: Secondary | ICD-10-CM | POA: Diagnosis not present

## 2023-02-19 DIAGNOSIS — E785 Hyperlipidemia, unspecified: Secondary | ICD-10-CM | POA: Diagnosis not present

## 2023-02-21 DIAGNOSIS — Z471 Aftercare following joint replacement surgery: Secondary | ICD-10-CM | POA: Diagnosis not present

## 2023-02-21 DIAGNOSIS — E119 Type 2 diabetes mellitus without complications: Secondary | ICD-10-CM | POA: Diagnosis not present

## 2023-02-21 DIAGNOSIS — K219 Gastro-esophageal reflux disease without esophagitis: Secondary | ICD-10-CM | POA: Diagnosis not present

## 2023-02-21 DIAGNOSIS — I10 Essential (primary) hypertension: Secondary | ICD-10-CM | POA: Diagnosis not present

## 2023-02-21 DIAGNOSIS — E785 Hyperlipidemia, unspecified: Secondary | ICD-10-CM | POA: Diagnosis not present

## 2023-02-21 DIAGNOSIS — Z96652 Presence of left artificial knee joint: Secondary | ICD-10-CM | POA: Diagnosis not present

## 2023-02-22 DIAGNOSIS — E785 Hyperlipidemia, unspecified: Secondary | ICD-10-CM | POA: Diagnosis not present

## 2023-02-22 DIAGNOSIS — I10 Essential (primary) hypertension: Secondary | ICD-10-CM | POA: Diagnosis not present

## 2023-02-22 DIAGNOSIS — Z471 Aftercare following joint replacement surgery: Secondary | ICD-10-CM | POA: Diagnosis not present

## 2023-02-22 DIAGNOSIS — Z96652 Presence of left artificial knee joint: Secondary | ICD-10-CM | POA: Diagnosis not present

## 2023-02-22 DIAGNOSIS — K219 Gastro-esophageal reflux disease without esophagitis: Secondary | ICD-10-CM | POA: Diagnosis not present

## 2023-02-22 DIAGNOSIS — E119 Type 2 diabetes mellitus without complications: Secondary | ICD-10-CM | POA: Diagnosis not present

## 2023-02-24 DIAGNOSIS — E119 Type 2 diabetes mellitus without complications: Secondary | ICD-10-CM | POA: Diagnosis not present

## 2023-02-24 DIAGNOSIS — E785 Hyperlipidemia, unspecified: Secondary | ICD-10-CM | POA: Diagnosis not present

## 2023-02-24 DIAGNOSIS — Z96652 Presence of left artificial knee joint: Secondary | ICD-10-CM | POA: Diagnosis not present

## 2023-02-24 DIAGNOSIS — Z471 Aftercare following joint replacement surgery: Secondary | ICD-10-CM | POA: Diagnosis not present

## 2023-02-24 DIAGNOSIS — K219 Gastro-esophageal reflux disease without esophagitis: Secondary | ICD-10-CM | POA: Diagnosis not present

## 2023-02-24 DIAGNOSIS — I10 Essential (primary) hypertension: Secondary | ICD-10-CM | POA: Diagnosis not present

## 2023-02-25 DIAGNOSIS — M1712 Unilateral primary osteoarthritis, left knee: Secondary | ICD-10-CM | POA: Diagnosis not present

## 2023-02-26 DIAGNOSIS — R262 Difficulty in walking, not elsewhere classified: Secondary | ICD-10-CM | POA: Diagnosis not present

## 2023-02-26 DIAGNOSIS — M25562 Pain in left knee: Secondary | ICD-10-CM | POA: Diagnosis not present

## 2023-03-05 DIAGNOSIS — R262 Difficulty in walking, not elsewhere classified: Secondary | ICD-10-CM | POA: Diagnosis not present

## 2023-03-05 DIAGNOSIS — M25562 Pain in left knee: Secondary | ICD-10-CM | POA: Diagnosis not present

## 2023-03-07 DIAGNOSIS — R262 Difficulty in walking, not elsewhere classified: Secondary | ICD-10-CM | POA: Diagnosis not present

## 2023-03-07 DIAGNOSIS — M25562 Pain in left knee: Secondary | ICD-10-CM | POA: Diagnosis not present

## 2023-03-10 DIAGNOSIS — R262 Difficulty in walking, not elsewhere classified: Secondary | ICD-10-CM | POA: Diagnosis not present

## 2023-03-10 DIAGNOSIS — M25562 Pain in left knee: Secondary | ICD-10-CM | POA: Diagnosis not present

## 2023-03-11 DIAGNOSIS — R262 Difficulty in walking, not elsewhere classified: Secondary | ICD-10-CM | POA: Diagnosis not present

## 2023-03-11 DIAGNOSIS — M25562 Pain in left knee: Secondary | ICD-10-CM | POA: Diagnosis not present

## 2023-03-17 DIAGNOSIS — R262 Difficulty in walking, not elsewhere classified: Secondary | ICD-10-CM | POA: Diagnosis not present

## 2023-03-17 DIAGNOSIS — M25562 Pain in left knee: Secondary | ICD-10-CM | POA: Diagnosis not present

## 2023-03-18 DIAGNOSIS — R262 Difficulty in walking, not elsewhere classified: Secondary | ICD-10-CM | POA: Diagnosis not present

## 2023-03-18 DIAGNOSIS — M25562 Pain in left knee: Secondary | ICD-10-CM | POA: Diagnosis not present

## 2023-03-24 DIAGNOSIS — M25562 Pain in left knee: Secondary | ICD-10-CM | POA: Diagnosis not present

## 2023-03-24 DIAGNOSIS — R262 Difficulty in walking, not elsewhere classified: Secondary | ICD-10-CM | POA: Diagnosis not present

## 2023-03-26 DIAGNOSIS — R262 Difficulty in walking, not elsewhere classified: Secondary | ICD-10-CM | POA: Diagnosis not present

## 2023-03-26 DIAGNOSIS — M25562 Pain in left knee: Secondary | ICD-10-CM | POA: Diagnosis not present

## 2023-04-07 DIAGNOSIS — E782 Mixed hyperlipidemia: Secondary | ICD-10-CM | POA: Diagnosis not present

## 2023-04-07 DIAGNOSIS — I10 Essential (primary) hypertension: Secondary | ICD-10-CM | POA: Diagnosis not present

## 2023-04-07 DIAGNOSIS — E559 Vitamin D deficiency, unspecified: Secondary | ICD-10-CM | POA: Diagnosis not present

## 2023-04-07 DIAGNOSIS — E118 Type 2 diabetes mellitus with unspecified complications: Secondary | ICD-10-CM | POA: Diagnosis not present

## 2023-04-11 DIAGNOSIS — N3281 Overactive bladder: Secondary | ICD-10-CM | POA: Diagnosis not present

## 2023-04-11 DIAGNOSIS — Z86010 Personal history of colonic polyps: Secondary | ICD-10-CM | POA: Diagnosis not present

## 2023-04-11 DIAGNOSIS — R600 Localized edema: Secondary | ICD-10-CM | POA: Diagnosis not present

## 2023-04-11 DIAGNOSIS — E876 Hypokalemia: Secondary | ICD-10-CM | POA: Diagnosis not present

## 2023-04-11 DIAGNOSIS — E118 Type 2 diabetes mellitus with unspecified complications: Secondary | ICD-10-CM | POA: Diagnosis not present

## 2023-04-11 DIAGNOSIS — E559 Vitamin D deficiency, unspecified: Secondary | ICD-10-CM | POA: Diagnosis not present

## 2023-04-11 DIAGNOSIS — K219 Gastro-esophageal reflux disease without esophagitis: Secondary | ICD-10-CM | POA: Diagnosis not present

## 2023-04-11 DIAGNOSIS — E782 Mixed hyperlipidemia: Secondary | ICD-10-CM | POA: Diagnosis not present

## 2023-04-11 DIAGNOSIS — I10 Essential (primary) hypertension: Secondary | ICD-10-CM | POA: Diagnosis not present

## 2023-04-11 DIAGNOSIS — E1169 Type 2 diabetes mellitus with other specified complication: Secondary | ICD-10-CM | POA: Diagnosis not present

## 2023-04-11 DIAGNOSIS — M25552 Pain in left hip: Secondary | ICD-10-CM | POA: Diagnosis not present

## 2023-04-23 DIAGNOSIS — D485 Neoplasm of uncertain behavior of skin: Secondary | ICD-10-CM | POA: Diagnosis not present

## 2023-04-23 DIAGNOSIS — Z1283 Encounter for screening for malignant neoplasm of skin: Secondary | ICD-10-CM | POA: Diagnosis not present

## 2023-04-23 DIAGNOSIS — L570 Actinic keratosis: Secondary | ICD-10-CM | POA: Diagnosis not present

## 2023-06-24 DIAGNOSIS — M25562 Pain in left knee: Secondary | ICD-10-CM | POA: Diagnosis not present

## 2023-07-31 DIAGNOSIS — U071 COVID-19: Secondary | ICD-10-CM | POA: Diagnosis not present

## 2023-07-31 DIAGNOSIS — Z713 Dietary counseling and surveillance: Secondary | ICD-10-CM | POA: Diagnosis not present

## 2023-08-21 DIAGNOSIS — M1711 Unilateral primary osteoarthritis, right knee: Secondary | ICD-10-CM | POA: Diagnosis not present

## 2023-09-04 DIAGNOSIS — E118 Type 2 diabetes mellitus with unspecified complications: Secondary | ICD-10-CM | POA: Diagnosis not present

## 2023-09-04 DIAGNOSIS — E782 Mixed hyperlipidemia: Secondary | ICD-10-CM | POA: Diagnosis not present

## 2023-09-04 DIAGNOSIS — E559 Vitamin D deficiency, unspecified: Secondary | ICD-10-CM | POA: Diagnosis not present

## 2023-09-04 DIAGNOSIS — I10 Essential (primary) hypertension: Secondary | ICD-10-CM | POA: Diagnosis not present

## 2023-09-10 DIAGNOSIS — I10 Essential (primary) hypertension: Secondary | ICD-10-CM | POA: Diagnosis not present

## 2023-09-10 DIAGNOSIS — E876 Hypokalemia: Secondary | ICD-10-CM | POA: Diagnosis not present

## 2023-09-10 DIAGNOSIS — Z6835 Body mass index (BMI) 35.0-35.9, adult: Secondary | ICD-10-CM | POA: Diagnosis not present

## 2023-09-10 DIAGNOSIS — E782 Mixed hyperlipidemia: Secondary | ICD-10-CM | POA: Diagnosis not present

## 2023-09-10 DIAGNOSIS — N3281 Overactive bladder: Secondary | ICD-10-CM | POA: Diagnosis not present

## 2023-09-10 DIAGNOSIS — Z79899 Other long term (current) drug therapy: Secondary | ICD-10-CM | POA: Diagnosis not present

## 2023-09-10 DIAGNOSIS — K219 Gastro-esophageal reflux disease without esophagitis: Secondary | ICD-10-CM | POA: Diagnosis not present

## 2023-09-10 DIAGNOSIS — Z713 Dietary counseling and surveillance: Secondary | ICD-10-CM | POA: Diagnosis not present

## 2023-09-10 DIAGNOSIS — Z7182 Exercise counseling: Secondary | ICD-10-CM | POA: Diagnosis not present

## 2023-09-10 DIAGNOSIS — E559 Vitamin D deficiency, unspecified: Secondary | ICD-10-CM | POA: Diagnosis not present

## 2023-09-10 DIAGNOSIS — E118 Type 2 diabetes mellitus with unspecified complications: Secondary | ICD-10-CM | POA: Diagnosis not present

## 2023-09-18 ENCOUNTER — Encounter: Payer: Self-pay | Admitting: Gastroenterology

## 2023-09-18 ENCOUNTER — Ambulatory Visit (INDEPENDENT_AMBULATORY_CARE_PROVIDER_SITE_OTHER): Payer: Medicare Other | Admitting: Gastroenterology

## 2023-09-18 VITALS — BP 138/76 | HR 73 | Temp 97.7°F | Ht 64.0 in | Wt 202.0 lb

## 2023-09-18 DIAGNOSIS — Z860100 Personal history of colon polyps, unspecified: Secondary | ICD-10-CM | POA: Diagnosis not present

## 2023-09-18 NOTE — Progress Notes (Signed)
GI Office Note    Referring Provider: Leone Payor, FNP Primary Care Physician:  Leone Payor, FNP  Primary Gastroenterologist: Gerrit Friends.Rourk, MD  Chief Complaint   Chief Complaint  Patient presents with   Follow-up    Follow up. No problems    History of Present Illness   Makayla Lucas is a 77 y.o. female presenting today at the request of Leone Payor, FNP for colonoscopy.   Last office visit September 2019.  Reportedly doing well overall.  Never had a colonoscopy at that point.  Denied any abdominal pain, nausea, vomiting, melena, BRBPR, unintentional weight loss, fever, chills, changes in bowel habits.  Also denied any chest pain, dyspnea, dizziness, lightheadedness, or syncope.  Scheduled for colonoscopy. (ASA 2?)  Colonoscopy 10/16/2018: -Two 9-11 mm polyps in the rectum and ileocecal valve -4 5-6 mm sessile polyps removed from ICV and sigmoid segments -Pathology revealed tubular adenomas and hyperplastic polyps. -Advise repeat colonoscopy in 3 years  Had issue with her prep the last time - no issue night prior but had issue the morning of.   Today: Feeling well overall.  No recent medication changes.  Denies any melena, BRBPR, changes in bowel habits, weight loss, lack of appetite, early satiety, shortness of breath, chest pain, dizziness, syncope, lightheadedness, abdominal pain, nausea, vomiting, dysphagia, reflux.  She does admit to occasional intermittent looser stools likely secondary to dietary intake.  Does have history of cholecystectomy many years ago.  Usually has a bowel movement daily, sometimes twice per day.  Does report that with her last colonoscopy prep she had to wake up at 4 AM to finish half of it and did vomit up some of this but otherwise tolerated the prep well.  Current Outpatient Medications  Medication Sig Dispense Refill   lisinopril-hydrochlorothiazide (PRINZIDE,ZESTORETIC) 20-12.5 MG tablet Take 1 tablet by mouth daily.      metFORMIN (GLUCOPHAGE) 500 MG tablet Take 500 mg by mouth at bedtime.     omeprazole (PRILOSEC) 20 MG capsule Take 20 mg by mouth daily.     oxybutynin (DITROPAN) 5 MG tablet Take 1 tablet by mouth daily.     potassium chloride (K-DUR,KLOR-CON) 10 MEQ tablet Take 1 tablet by mouth daily.     pravastatin (PRAVACHOL) 20 MG tablet Take 1 tablet by mouth daily.     No current facility-administered medications for this visit.    Past Medical History:  Diagnosis Date   Diabetes ([facility])    GERD (gastroesophageal reflux disease)    Hypercholesterolemia    Hypertension    OAB (overactive bladder)     Past Surgical History:  Procedure Laterality Date   ABDOMINAL HYSTERECTOMY  1977   COLONOSCOPY N/A 10/16/2018   Procedure: COLONOSCOPY;  Surgeon: Corbin Ade, MD;  Location: [facility];  Service: Endoscopy;  Laterality: N/A;  9:30am   LAPAROSCOPIC CHOLECYSTECTOMY  2015   POLYPECTOMY  10/16/2018   Procedure: POLYPECTOMY;  Surgeon: Corbin Ade, MD;  Location: [facility];  Service: Endoscopy;;  colon    Family History  Problem Relation Age of Onset   Breast cancer Maternal Aunt    Colon cancer Neg Hx     Allergies as of 09/18/2023   (No Known Allergies)    Social History   Socioeconomic History   Marital status: Single    Spouse name: Not on file   Number of children: Not on file   Years of education: Not on file   Highest education level: Not on  file  Occupational History   Not on file  Tobacco Use   Smoking status: Former    Types: Cigarettes   Smokeless tobacco: Never  Vaping Use   Vaping status: Never Used  Substance and Sexual Activity   Alcohol use: Yes    Comment: seldom   Drug use: Never   Sexual activity: Not on file  Other Topics Concern   Not on file  Social History Narrative   Not on file   Social Determinants of Health   Financial Resource Strain: Not on file  Food Insecurity: Not on file  Transportation Needs: Not on file  Physical  Activity: Not on file  Stress: Not on file  Social Connections: Not on file  Intimate Partner Violence: Not on file   Review of Systems   Gen: Denies any fever, chills, fatigue, weight loss, lack of appetite.  CV: Denies chest pain, heart palpitations, peripheral edema, syncope.  Resp: Denies shortness of breath at rest or with exertion. Denies wheezing or cough.  GI: see HPI GU : Denies urinary burning, urinary frequency, urinary hesitancy MS: + Right knee pain.  Denies muscle weakness, cramp.  Derm: Denies rash, itching, dry skin Psych: Denies depression, anxiety, memory loss, and confusion Heme: Denies bruising, bleeding, and enlarged lymph nodes.  Physical Exam   BP 138/76 (BP Location: Right Arm, Patient Position: Sitting, Cuff Size: Normal)   Pulse 73   Temp 97.7 F (36.5 C) (Temporal)   Ht 5\' 4"  (1.626 m)   Wt 202 lb (91.6 kg)   SpO2 95%   BMI 34.67 kg/m   General:   Alert and oriented. Pleasant and cooperative. Well-nourished and well-developed.  Head:  Normocephalic and atraumatic. Eyes:  Without icterus, sclera clear and conjunctiva pink.  Ears:  Normal auditory acuity. Mouth:  No deformity or lesions, oral mucosa pink.  Lungs:  Clear to auscultation bilaterally. No wheezes, rales, or rhonchi. No distress.  Heart:  S1, S2 present without murmurs appreciated.  Abdomen:  +BS, soft, non-tender and non-distended. No HSM noted. No guarding or rebound. No masses appreciated.  Rectal:  Deferred  Msk: Using single-point cane, slight limp to right lower extremity. Extremities: Mild nonpitting peripheral edema bilaterally Neurologic:  Alert and  oriented x4;  grossly normal neurologically. Skin:  Intact without significant lesions or rashes. Psych:  Alert and cooperative. Normal mood and affect.  Assessment   Makayla Lucas is a 77 y.o. female with a history of prediabetes,  presenting today with   History of adenomatous colon polyps: Colonoscopy in 2019 with  multiple tubular adenomas and hyperplastic polyps removed from the ICV, sigmoid, and rectum.  Recommended surveillance in 3 years, currently well overdue.  No alarm symptoms present.  No upper or lower GI complaints today.  Will proceed with scheduling surveillance colonoscopy in the near future with Dr. Jena Gauss.  Discussed necessary medication adjustments today.  PLAN   Proceed with colonoscopy with propofol by Dr. Jena Gauss in near future: the risks, benefits, and alternatives have been discussed with the patient in detail. The patient states understanding and desires to proceed. ASA 2 Hold metformin night prior BMP pre-op Follow up as needed.    Brooke Bonito, MSN, FNP-BC, AGACNP-BC [facility]

## 2023-09-18 NOTE — Patient Instructions (Signed)
We will get you scheduled for colonoscopy in the near future with Dr. Jena Gauss.  You will receive separate detailed written instructions regarding your prep and medication adjustments.  It was a pleasure to meet you today!  It was a pleasure to see you today. I want to create trusting relationships with patients. If you receive a survey regarding your visit,  I greatly appreciate you taking time to fill this out on paper or through your MyChart. I value your feedback.  Brooke Bonito, MSN, FNP-BC, AGACNP-BC [facility]

## 2023-09-24 ENCOUNTER — Telehealth: Payer: Self-pay | Admitting: *Deleted

## 2023-09-24 DIAGNOSIS — M1711 Unilateral primary osteoarthritis, right knee: Secondary | ICD-10-CM | POA: Diagnosis not present

## 2023-09-24 NOTE — Telephone Encounter (Signed)
LMOVM to call back to schedule TCS with Dr. Jena Gauss, ASA 2 mid-late November. Hold metformin night prior. BMET prior

## 2023-09-25 MED ORDER — PEG 3350-KCL-NA BICARB-NACL 420 G PO SOLR: 4000.0000 mL | Freq: Once | ORAL | 0 refills | Status: AC

## 2023-09-25 NOTE — Addendum Note (Signed)
Addended by: Armstead Peaks on: 09/25/2023 02:15 PM   Modules accepted: Orders

## 2023-09-25 NOTE — Telephone Encounter (Signed)
PT CALLED BACK. She is scheduled for 12/5 with Dr. Jena Gauss. Aware will send instructions. Rx for prep sent to pharmacy.

## 2023-09-29 DIAGNOSIS — R7309 Other abnormal glucose: Secondary | ICD-10-CM | POA: Diagnosis not present

## 2023-09-29 DIAGNOSIS — H25813 Combined forms of age-related cataract, bilateral: Secondary | ICD-10-CM | POA: Diagnosis not present

## 2023-10-10 DIAGNOSIS — Z96651 Presence of right artificial knee joint: Secondary | ICD-10-CM | POA: Diagnosis not present

## 2023-10-10 DIAGNOSIS — G8918 Other acute postprocedural pain: Secondary | ICD-10-CM | POA: Diagnosis not present

## 2023-10-10 DIAGNOSIS — M21161 Varus deformity, not elsewhere classified, right knee: Secondary | ICD-10-CM | POA: Diagnosis not present

## 2023-10-10 DIAGNOSIS — M25761 Osteophyte, right knee: Secondary | ICD-10-CM | POA: Diagnosis not present

## 2023-10-10 DIAGNOSIS — M1711 Unilateral primary osteoarthritis, right knee: Secondary | ICD-10-CM | POA: Diagnosis not present

## 2023-10-13 DIAGNOSIS — Z96653 Presence of artificial knee joint, bilateral: Secondary | ICD-10-CM | POA: Diagnosis not present

## 2023-10-13 DIAGNOSIS — Z7982 Long term (current) use of aspirin: Secondary | ICD-10-CM | POA: Diagnosis not present

## 2023-10-13 DIAGNOSIS — K219 Gastro-esophageal reflux disease without esophagitis: Secondary | ICD-10-CM | POA: Diagnosis not present

## 2023-10-13 DIAGNOSIS — Z791 Long term (current) use of non-steroidal anti-inflammatories (NSAID): Secondary | ICD-10-CM | POA: Diagnosis not present

## 2023-10-13 DIAGNOSIS — Z471 Aftercare following joint replacement surgery: Secondary | ICD-10-CM | POA: Diagnosis not present

## 2023-10-13 DIAGNOSIS — Z7984 Long term (current) use of oral hypoglycemic drugs: Secondary | ICD-10-CM | POA: Diagnosis not present

## 2023-10-13 DIAGNOSIS — E785 Hyperlipidemia, unspecified: Secondary | ICD-10-CM | POA: Diagnosis not present

## 2023-10-13 DIAGNOSIS — I10 Essential (primary) hypertension: Secondary | ICD-10-CM | POA: Diagnosis not present

## 2023-10-13 DIAGNOSIS — E119 Type 2 diabetes mellitus without complications: Secondary | ICD-10-CM | POA: Diagnosis not present

## 2023-10-15 DIAGNOSIS — Z96653 Presence of artificial knee joint, bilateral: Secondary | ICD-10-CM | POA: Diagnosis not present

## 2023-10-15 DIAGNOSIS — E785 Hyperlipidemia, unspecified: Secondary | ICD-10-CM | POA: Diagnosis not present

## 2023-10-15 DIAGNOSIS — I10 Essential (primary) hypertension: Secondary | ICD-10-CM | POA: Diagnosis not present

## 2023-10-15 DIAGNOSIS — Z471 Aftercare following joint replacement surgery: Secondary | ICD-10-CM | POA: Diagnosis not present

## 2023-10-15 DIAGNOSIS — K219 Gastro-esophageal reflux disease without esophagitis: Secondary | ICD-10-CM | POA: Diagnosis not present

## 2023-10-15 DIAGNOSIS — E119 Type 2 diabetes mellitus without complications: Secondary | ICD-10-CM | POA: Diagnosis not present

## 2023-10-17 DIAGNOSIS — E119 Type 2 diabetes mellitus without complications: Secondary | ICD-10-CM | POA: Diagnosis not present

## 2023-10-17 DIAGNOSIS — E785 Hyperlipidemia, unspecified: Secondary | ICD-10-CM | POA: Diagnosis not present

## 2023-10-17 DIAGNOSIS — Z471 Aftercare following joint replacement surgery: Secondary | ICD-10-CM | POA: Diagnosis not present

## 2023-10-17 DIAGNOSIS — K219 Gastro-esophageal reflux disease without esophagitis: Secondary | ICD-10-CM | POA: Diagnosis not present

## 2023-10-17 DIAGNOSIS — Z96653 Presence of artificial knee joint, bilateral: Secondary | ICD-10-CM | POA: Diagnosis not present

## 2023-10-17 DIAGNOSIS — I10 Essential (primary) hypertension: Secondary | ICD-10-CM | POA: Diagnosis not present

## 2023-10-20 DIAGNOSIS — Z96653 Presence of artificial knee joint, bilateral: Secondary | ICD-10-CM | POA: Diagnosis not present

## 2023-10-20 DIAGNOSIS — K219 Gastro-esophageal reflux disease without esophagitis: Secondary | ICD-10-CM | POA: Diagnosis not present

## 2023-10-20 DIAGNOSIS — E785 Hyperlipidemia, unspecified: Secondary | ICD-10-CM | POA: Diagnosis not present

## 2023-10-20 DIAGNOSIS — I10 Essential (primary) hypertension: Secondary | ICD-10-CM | POA: Diagnosis not present

## 2023-10-20 DIAGNOSIS — E119 Type 2 diabetes mellitus without complications: Secondary | ICD-10-CM | POA: Diagnosis not present

## 2023-10-20 DIAGNOSIS — Z471 Aftercare following joint replacement surgery: Secondary | ICD-10-CM | POA: Diagnosis not present

## 2023-10-21 DIAGNOSIS — M1711 Unilateral primary osteoarthritis, right knee: Secondary | ICD-10-CM | POA: Diagnosis not present

## 2023-10-22 DIAGNOSIS — M25561 Pain in right knee: Secondary | ICD-10-CM | POA: Diagnosis not present

## 2023-10-23 ENCOUNTER — Encounter: Payer: Self-pay | Admitting: *Deleted

## 2023-10-23 NOTE — Telephone Encounter (Signed)
Pt called to reschedule procedure on 11/20/23. She has been rescheduled until 11/27/23 at 7:30 am. Updated instructions mailed

## 2023-10-24 DIAGNOSIS — M25561 Pain in right knee: Secondary | ICD-10-CM | POA: Diagnosis not present

## 2023-10-29 DIAGNOSIS — M25561 Pain in right knee: Secondary | ICD-10-CM | POA: Diagnosis not present

## 2023-10-31 DIAGNOSIS — M25561 Pain in right knee: Secondary | ICD-10-CM | POA: Diagnosis not present

## 2023-11-03 DIAGNOSIS — M25561 Pain in right knee: Secondary | ICD-10-CM | POA: Diagnosis not present

## 2023-11-05 DIAGNOSIS — M25561 Pain in right knee: Secondary | ICD-10-CM | POA: Diagnosis not present

## 2023-11-10 DIAGNOSIS — M25561 Pain in right knee: Secondary | ICD-10-CM | POA: Diagnosis not present

## 2023-11-12 DIAGNOSIS — M25561 Pain in right knee: Secondary | ICD-10-CM | POA: Diagnosis not present

## 2023-11-17 DIAGNOSIS — M25561 Pain in right knee: Secondary | ICD-10-CM | POA: Diagnosis not present

## 2023-11-19 DIAGNOSIS — M25561 Pain in right knee: Secondary | ICD-10-CM | POA: Diagnosis not present

## 2023-11-27 ENCOUNTER — Ambulatory Visit (HOSPITAL_COMMUNITY): Payer: Self-pay | Admitting: Anesthesiology

## 2023-11-27 ENCOUNTER — Encounter (HOSPITAL_COMMUNITY): Payer: Self-pay | Admitting: Internal Medicine

## 2023-11-27 ENCOUNTER — Encounter (HOSPITAL_COMMUNITY)
Admission: RE | Disposition: A | Discharge status: Home / Self Care | Payer: Self-pay | Source: Home / Self Care | Attending: Internal Medicine

## 2023-11-27 ENCOUNTER — Other Ambulatory Visit: Payer: Self-pay

## 2023-11-27 ENCOUNTER — Ambulatory Visit (HOSPITAL_BASED_OUTPATIENT_CLINIC_OR_DEPARTMENT_OTHER): Payer: Medicare Other | Admitting: Anesthesiology

## 2023-11-27 ENCOUNTER — Ambulatory Visit (HOSPITAL_COMMUNITY)
Admission: RE | Admit: 2023-11-27 | Discharge: 2023-11-27 | Disposition: A | Discharge status: Home / Self Care | Payer: Medicare Other | Attending: Internal Medicine | Admitting: Internal Medicine

## 2023-11-27 DIAGNOSIS — K219 Gastro-esophageal reflux disease without esophagitis: Secondary | ICD-10-CM | POA: Insufficient documentation

## 2023-11-27 DIAGNOSIS — I10 Essential (primary) hypertension: Secondary | ICD-10-CM | POA: Insufficient documentation

## 2023-11-27 DIAGNOSIS — K621 Rectal polyp: Secondary | ICD-10-CM | POA: Diagnosis not present

## 2023-11-27 DIAGNOSIS — K640 First degree hemorrhoids: Secondary | ICD-10-CM | POA: Insufficient documentation

## 2023-11-27 DIAGNOSIS — Z87891 Personal history of nicotine dependence: Secondary | ICD-10-CM | POA: Diagnosis not present

## 2023-11-27 DIAGNOSIS — Z7984 Long term (current) use of oral hypoglycemic drugs: Secondary | ICD-10-CM | POA: Diagnosis not present

## 2023-11-27 DIAGNOSIS — E119 Type 2 diabetes mellitus without complications: Secondary | ICD-10-CM | POA: Diagnosis not present

## 2023-11-27 DIAGNOSIS — Z860100 Personal history of colon polyps, unspecified: Secondary | ICD-10-CM

## 2023-11-27 DIAGNOSIS — Z1211 Encounter for screening for malignant neoplasm of colon: Secondary | ICD-10-CM | POA: Insufficient documentation

## 2023-11-27 DIAGNOSIS — K635 Polyp of colon: Secondary | ICD-10-CM | POA: Diagnosis not present

## 2023-11-27 DIAGNOSIS — D124 Benign neoplasm of descending colon: Secondary | ICD-10-CM

## 2023-11-27 HISTORY — PX: POLYPECTOMY: SHX5525

## 2023-11-27 HISTORY — PX: COLONOSCOPY WITH PROPOFOL: SHX5780

## 2023-11-27 LAB — GLUCOSE, CAPILLARY: Glucose-Capillary: 139 mg/dL — ABNORMAL HIGH (ref 70–99)

## 2023-11-27 SURGERY — COLONOSCOPY WITH PROPOFOL
Anesthesia: General

## 2023-11-27 MED ORDER — EPHEDRINE 5 MG/ML INJ
INTRAVENOUS | Status: AC
  Filled 2023-11-27: qty 5

## 2023-11-27 MED ORDER — PROPOFOL 500 MG/50ML IV EMUL
INTRAVENOUS | Status: DC | PRN
  Administered 2023-11-27: 150 ug/kg/min via INTRAVENOUS

## 2023-11-27 MED ORDER — EPHEDRINE SULFATE-NACL 50-0.9 MG/10ML-% IV SOSY
PREFILLED_SYRINGE | INTRAVENOUS | Status: DC | PRN
  Administered 2023-11-27: 15 mg via INTRAVENOUS

## 2023-11-27 MED ORDER — PHENYLEPHRINE 80 MCG/ML (10ML) SYRINGE FOR IV PUSH (FOR BLOOD PRESSURE SUPPORT)
PREFILLED_SYRINGE | INTRAVENOUS | Status: DC | PRN
  Administered 2023-11-27: 80 ug via INTRAVENOUS

## 2023-11-27 MED ORDER — PHENYLEPHRINE 80 MCG/ML (10ML) SYRINGE FOR IV PUSH (FOR BLOOD PRESSURE SUPPORT)
PREFILLED_SYRINGE | INTRAVENOUS | Status: AC
Start: 2023-11-27 — End: ?
  Filled 2023-11-27: qty 10

## 2023-11-27 MED ORDER — PROPOFOL 10 MG/ML IV BOLUS
INTRAVENOUS | Status: DC | PRN
  Administered 2023-11-27: 40 mg via INTRAVENOUS
  Administered 2023-11-27: 80 mg via INTRAVENOUS

## 2023-11-27 MED ORDER — LIDOCAINE HCL (CARDIAC) PF 100 MG/5ML IV SOSY
PREFILLED_SYRINGE | INTRAVENOUS | Status: DC | PRN
  Administered 2023-11-27: 60 mg via INTRAVENOUS

## 2023-11-27 MED ORDER — EPHEDRINE 5 MG/ML INJ
INTRAVENOUS | Status: AC
Start: 2023-11-27 — End: ?
  Filled 2023-11-27: qty 5

## 2023-11-27 MED ORDER — PROPOFOL 1000 MG/100ML IV EMUL
INTRAVENOUS | Status: AC
Start: 2023-11-27 — End: ?
  Filled 2023-11-27: qty 100

## 2023-11-27 MED ORDER — LACTATED RINGERS IV SOLN: INTRAVENOUS | Status: DC

## 2023-11-27 MED ORDER — LIDOCAINE HCL (PF) 2 % IJ SOLN
INTRAMUSCULAR | Status: AC
Start: 2023-11-27 — End: ?
  Filled 2023-11-27: qty 5

## 2023-11-27 NOTE — Discharge Instructions (Addendum)
  Colonoscopy Discharge Instructions  Read the instructions outlined below and refer to this sheet in the next few weeks. These discharge instructions provide you with general information on caring for yourself after you leave the hospital. Your doctor may also give you specific instructions. While your treatment has been planned according to the most current medical practices available, unavoidable complications occasionally occur. If you have any problems or questions after discharge, call Dr. Jena Gauss at [phone]. ACTIVITY You may resume your regular activity, but move at a slower pace for the next 24 hours.  Take frequent rest periods for the next 24 hours.  Walking will help get rid of the air and reduce the bloated feeling in your belly (abdomen).  No driving for 24 hours (because of the medicine (anesthesia) used during the test).   Do not sign any important legal documents or operate any machinery for 24 hours (because of the anesthesia used during the test).  NUTRITION Drink plenty of fluids.  You may resume your normal diet as instructed by your doctor.  Begin with a light meal and progress to your normal diet. Heavy or fried foods are harder to digest and may make you feel sick to your stomach (nauseated).  Avoid alcoholic beverages for 24 hours or as instructed.  MEDICATIONS You may resume your normal medications unless your doctor tells you otherwise.  WHAT YOU CAN EXPECT TODAY Some feelings of bloating in the abdomen.  Passage of more gas than usual.  Spotting of blood in your stool or on the toilet paper.  IF YOU HAD POLYPS REMOVED DURING THE COLONOSCOPY: No aspirin products for 7 days or as instructed.  No alcohol for 7 days or as instructed.  Eat a soft diet for the next 24 hours.  FINDING OUT THE RESULTS OF YOUR TEST Not all test results are available during your visit. If your test results are not back during the visit, make an appointment with your caregiver to find out the  results. Do not assume everything is normal if you have not heard from your caregiver or the medical facility. It is important for you to follow up on all of your test results.  SEEK IMMEDIATE MEDICAL ATTENTION IF: You have more than a spotting of blood in your stool.  Your belly is swollen (abdominal distention).  You are nauseated or vomiting.  You have a temperature over 101.  You have abdominal pain or discomfort that is severe or gets worse throughout the day.    2 small polyps removed in your colon today  Further recommendations to follow pending review of pathology report  At patient request, called Yukiko Galperin at [phone] -reviewed findings and recommendations  Follow up with Dr. 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

## 2023-11-27 NOTE — Op Note (Signed)
[facility] Patient Name: Makayla Lucas Procedure Date: 11/27/2023 7:11 AM MRN: [id number] Date of Birth: March 04, 1946 Attending MD: Gennette Pac , MD, [id number] CSN: [id number] Age: 77 Admit Type: Outpatient Procedure:                Colonoscopy Indications:              High risk colon cancer surveillance: Personal                            history of colonic polyps Providers:                Gennette Pac, MD, Sheran Fava,                            Zena Amos Referring MD:             Gennette Pac, MD Medicines:                Propofol per Anesthesia Complications:            No immediate complications. Estimated Blood Loss:     Estimated blood loss was minimal. Procedure:                Pre-Anesthesia Assessment:                           - Prior to the procedure, a History and Physical                            was performed, and patient medications and                            allergies were reviewed. The patient's tolerance of                            previous anesthesia was also reviewed. The risks                            and benefits of the procedure and the sedation                            options and risks were discussed with the patient.                            All questions were answered, and informed consent                            was obtained. Prior Anticoagulants: The patient has                            taken no anticoagulant or antiplatelet agents. ASA                            Grade Assessment: III - A patient with severe  systemic disease. After reviewing the risks and                            benefits, the patient was deemed in satisfactory                            condition to undergo the procedure.                           After obtaining informed consent, the colonoscope                            was passed under direct vision. Throughout the                             procedure, the patient's blood pressure, pulse, and                            oxygen saturations were monitored continuously. The                            [id number] scope was introduced through the                            anus and advanced to the the cecum, identified by                            appendiceal orifice and ileocecal valve. The                            colonoscopy was performed without difficulty. The                            patient tolerated the procedure well. The quality                            of the bowel preparation was adequate. The                            ileocecal valve, appendiceal orifice, and rectum                            were photographed. Scope In: 7:41:24 AM Scope Out: 7:55:06 AM Scope Withdrawal Time: 0 hours 8 minutes 31 seconds  Total Procedure Duration: 0 hours 13 minutes 42 seconds  Findings:      The perianal and digital rectal examinations were normal.      Non-bleeding internal hemorrhoids were found during retroflexion. The       hemorrhoids were moderate, medium-sized and Grade I (internal       hemorrhoids that do not prolapse).      Two sessile polyps were found in the mid rectum, descending colon and       mid descending colon. The polyps were 3 to 5 mm in size. These polyps       were removed with a cold  snare. Resection and retrieval were complete.       Estimated blood loss was minimal.      The exam was otherwise without abnormality on direct and retroflexion       views. Impression:               - Non-bleeding internal hemorrhoids.                           - Two 3 to 5 mm polyps in the mid rectum, in the                            descending colon and in the mid descending colon,                            removed with a cold snare. Resected and retrieved.                           - The examination was otherwise normal on direct                            and retroflexion views. Moderate Sedation:       Moderate (conscious) sedation was personally administered by an       anesthesia professional. The following parameters were monitored: oxygen       saturation, heart rate, blood pressure, respiratory rate, EKG, adequacy       of pulmonary ventilation, and response to care. Recommendation:           - Patient has a contact number available for                            emergencies. The signs and symptoms of potential                            delayed complications were discussed with the                            patient. Return to normal activities tomorrow.                            Written discharge instructions were provided to the                            patient.                           - Advance diet as tolerated.                           - Continue present medications.                           - Repeat colonoscopy date to be determined after                            pending pathology results are reviewed for  surveillance based on pathology results.                           - Return to GI office (date not yet determined). Procedure Code(s):        --- Professional ---                           [phone], Colonoscopy, flexible; with removal of                            tumor(s), polyp(s), or other lesion(s) by snare                            technique Diagnosis Code(s):        --- Professional ---                           Z86.010, Personal history of colonic polyps                           D12.8, Benign neoplasm of rectum                           D12.4, Benign neoplasm of descending colon                           K64.0, First degree hemorrhoids CPT copyright 2022 [employer]. All rights reserved. The codes documented in this report are preliminary and upon coder review may  be revised to meet current compliance requirements. Gerrit Friends. Tylan Briguglio, MD Gennette Pac, MD 11/27/2023 8:57:22 AM This report has been signed  electronically. Number of Addenda: 0

## 2023-11-27 NOTE — H&P (Signed)
@LOGO @   Primary Care Physician:  Leone Payor, FNP Primary Gastroenterologist:  Dr.   Pre-Procedure History & Physical: HPI:  Makayla Lucas is a 77 y.o. female here for colonoscopy.  History of multiple greater than 10 mm removed 2019; somewhat overdue for surveillance.  Past Medical History:  Diagnosis Date   Diabetes (HCC)    GERD (gastroesophageal reflux disease)    Hypercholesterolemia    Hypertension    OAB (overactive bladder)     Past Surgical History:  Procedure Laterality Date   ABDOMINAL HYSTERECTOMY  1977   COLONOSCOPY N/A 10/16/2018   Procedure: COLONOSCOPY;  Surgeon: Corbin Ade, MD;  Location: [facility];  Service: Endoscopy;  Laterality: N/A;  9:30am   LAPAROSCOPIC CHOLECYSTECTOMY  2015   POLYPECTOMY  10/16/2018   Procedure: POLYPECTOMY;  Surgeon: Corbin Ade, MD;  Location: [facility];  Service: Endoscopy;;  colon    Prior to Admission medications   Medication Sig Start Date End Date Taking? Authorizing Provider  lisinopril-hydrochlorothiazide (PRINZIDE,ZESTORETIC) 20-12.5 MG tablet Take 1 tablet by mouth daily. 06/11/18   [provider]  metFORMIN (GLUCOPHAGE) 500 MG tablet Take 500 mg by mouth at bedtime.    [provider]  omeprazole (PRILOSEC) 20 MG capsule Take 20 mg by mouth daily.    [provider]  oxybutynin (DITROPAN) 5 MG tablet Take 1 tablet by mouth daily. 07/06/18   [provider]  potassium chloride (K-DUR,KLOR-CON) 10 MEQ tablet Take 1 tablet by mouth daily. 06/11/18   [provider]  pravastatin (PRAVACHOL) 20 MG tablet Take 1 tablet by mouth daily.    [provider]  Vitamin D, Ergocalciferol, (DRISDOL) 1.25 MG (50000 UNIT) CAPS capsule Take 50,000 Units by mouth once a week.    [provider]    Allergies as of 09/25/2023   (No Known Allergies)    Family History  Problem Relation Age of Onset   Breast cancer Maternal Aunt    Colon cancer Neg Hx      Social History   Socioeconomic History   Marital status: Single    Spouse name: Not on file   Number of children: Not on file   Years of education: Not on file   Highest education level: Not on file  Occupational History   Not on file  Tobacco Use   Smoking status: Former    Types: Cigarettes   Smokeless tobacco: Never  Vaping Use   Vaping status: Never Used  Substance and Sexual Activity   Alcohol use: Yes    Comment: seldom   Drug use: Never   Sexual activity: Not on file  Other Topics Concern   Not on file  Social History Narrative   Not on file   Social Drivers of Health   Financial Resource Strain: Not on file  Food Insecurity: Not on file  Transportation Needs: Not on file  Physical Activity: Not on file  Stress: Not on file  Social Connections: Not on file  Intimate Partner Violence: Not on file    Review of Systems: See HPI, otherwise negative ROS  Physical Exam: BP (!) 149/55   Pulse 68   Temp 97.9 F (36.6 C) (Oral)   Resp 17   Ht 5\' 4"  (1.626 m)   Wt 87.1 kg   SpO2 93%   BMI 32.96 kg/m  General:   Alert,  Well-developed, well-nourished, pleasant and cooperative in NAD Lungs:  Clear throughout to auscultation.   No wheezes, crackles,  or rhonchi. No acute distress. Heart:  Regular rate and rhythm; no murmurs, clicks, rubs,  or gallops. Abdomen: Non-distended, normal bowel sounds.  Soft and nontender without appreciable mass or hepatosplenomegaly.   Impression/Plan: 77 year old lady here for surveillance colonoscopy. The risks, benefits, limitations, alternatives and imponderables have been reviewed with the patient. Questions have been answered. All parties are agreeable.       Notice: This dictation was prepared with Dragon dictation along with smaller phrase technology. Any transcriptional errors that result from this process are unintentional and may not be corrected upon review.

## 2023-11-27 NOTE — Anesthesia Preprocedure Evaluation (Signed)
Anesthesia Evaluation  Patient identified by MRN, date of birth, ID band Patient awake    Reviewed: Allergy & Precautions, H&P , NPO status , Patient's Chart, lab work & pertinent test results, reviewed documented beta blocker date and time   Airway Mallampati: II  TM Distance: >3 FB Neck ROM: full    Dental no notable dental hx.    Pulmonary neg pulmonary ROS, former smoker   Pulmonary exam normal breath sounds clear to auscultation       Cardiovascular Exercise Tolerance: Good hypertension,  Rhythm:regular Rate:Normal     Neuro/Psych negative neurological ROS  negative psych ROS   GI/Hepatic Neg liver ROS,GERD  ,,  Endo/Other  diabetes    Renal/GU negative Renal ROS  negative genitourinary   Musculoskeletal   Abdominal   Peds  Hematology negative hematology ROS (+)   Anesthesia Other Findings   Reproductive/Obstetrics negative OB ROS                             Anesthesia Physical Anesthesia Plan  ASA: 2  Anesthesia Plan: General   Post-op Pain Management:    Induction:   PONV Risk Score and Plan: Propofol infusion  Airway Management Planned:   Additional Equipment:   Intra-op Plan:   Post-operative Plan:   Informed Consent: I have reviewed the patients History and Physical, chart, labs and discussed the procedure including the risks, benefits and alternatives for the proposed anesthesia with the patient or authorized representative who has indicated his/her understanding and acceptance.     Dental Advisory Given  Plan Discussed with: CRNA  Anesthesia Plan Comments:        Anesthesia Quick Evaluation

## 2023-11-27 NOTE — Transfer of Care (Addendum)
Immediate Anesthesia Transfer of Care Note  Patient: Makayla Lucas  Procedure(s) Performed: COLONOSCOPY WITH PROPOFOL POLYPECTOMY  Patient Location: Endoscopy Unit  Anesthesia Type:General  Level of Consciousness: drowsy and patient cooperative  Airway & Oxygen Therapy: Patient Spontanous Breathing and Patient connected to nasal cannula oxygen  Post-op Assessment: Report given to RN and Post -op Vital signs reviewed and stable  Post vital signs: Reviewed and stable  Last Vitals:  Vitals //Value Taken Time  BP 87/35 11/27/23   0801  Temp 36.4 11/27/23   0801  Pulse 68 11/27/23   0801  Resp 16 11/27/23   0801  SpO2 97% 11/27/23   0801    Last Pain:  Vitals:   11/27/23 0738  TempSrc:   PainSc: 0-No pain         Complications: Subsequent blood pressure 100/83 at 0810.

## 2023-11-27 NOTE — Anesthesia Postprocedure Evaluation (Signed)
Anesthesia Post Note  Patient: [name]  Procedure(s) Performed: COLONOSCOPY WITH PROPOFOL POLYPECTOMY  Patient location during evaluation: Phase II Anesthesia Type: General Level of consciousness: awake Pain management: pain level controlled Vital Signs Assessment: post-procedure vital signs reviewed and stable Respiratory status: spontaneous breathing and respiratory function stable Cardiovascular status: blood pressure returned to baseline and stable Postop Assessment: no headache and no apparent nausea or vomiting Anesthetic complications: no Comments: Late entry   No notable events documented.   Last Vitals:  Vitals:   11/27/23 0810 11/27/23 0823  BP: 100/83 (!) 114/55  Pulse: 66 60  Resp: 16 16  Temp:    SpO2: 97% 97%    Last Pain:  Vitals:   11/27/23 0801  TempSrc: Axillary  PainSc: 0-No pain                 Windell Norfolk

## 2023-11-28 DIAGNOSIS — I440 Atrioventricular block, first degree: Secondary | ICD-10-CM | POA: Diagnosis not present

## 2023-11-28 LAB — SURGICAL PATHOLOGY

## 2023-12-03 ENCOUNTER — Encounter: Payer: Self-pay | Admitting: Internal Medicine

## 2023-12-04 ENCOUNTER — Encounter (HOSPITAL_COMMUNITY): Payer: Self-pay | Admitting: Internal Medicine

## 2024-02-17 DIAGNOSIS — M25562 Pain in left knee: Secondary | ICD-10-CM | POA: Diagnosis not present

## 2024-02-17 DIAGNOSIS — M25561 Pain in right knee: Secondary | ICD-10-CM | POA: Diagnosis not present

## 2024-03-03 DIAGNOSIS — E559 Vitamin D deficiency, unspecified: Secondary | ICD-10-CM | POA: Diagnosis not present

## 2024-03-03 DIAGNOSIS — E782 Mixed hyperlipidemia: Secondary | ICD-10-CM | POA: Diagnosis not present

## 2024-03-03 DIAGNOSIS — E118 Type 2 diabetes mellitus with unspecified complications: Secondary | ICD-10-CM | POA: Diagnosis not present

## 2024-03-03 DIAGNOSIS — I10 Essential (primary) hypertension: Secondary | ICD-10-CM | POA: Diagnosis not present

## 2024-03-09 DIAGNOSIS — I10 Essential (primary) hypertension: Secondary | ICD-10-CM | POA: Diagnosis not present

## 2024-03-09 DIAGNOSIS — Z6836 Body mass index (BMI) 36.0-36.9, adult: Secondary | ICD-10-CM | POA: Diagnosis not present

## 2024-03-09 DIAGNOSIS — Z860100 Personal history of colon polyps, unspecified: Secondary | ICD-10-CM | POA: Diagnosis not present

## 2024-03-09 DIAGNOSIS — E559 Vitamin D deficiency, unspecified: Secondary | ICD-10-CM | POA: Diagnosis not present

## 2024-03-09 DIAGNOSIS — E118 Type 2 diabetes mellitus with unspecified complications: Secondary | ICD-10-CM | POA: Diagnosis not present

## 2024-03-09 DIAGNOSIS — E782 Mixed hyperlipidemia: Secondary | ICD-10-CM | POA: Diagnosis not present

## 2024-03-09 DIAGNOSIS — N3281 Overactive bladder: Secondary | ICD-10-CM | POA: Diagnosis not present

## 2024-03-09 DIAGNOSIS — R600 Localized edema: Secondary | ICD-10-CM | POA: Diagnosis not present

## 2024-03-09 DIAGNOSIS — E876 Hypokalemia: Secondary | ICD-10-CM | POA: Diagnosis not present

## 2024-03-09 DIAGNOSIS — E669 Obesity, unspecified: Secondary | ICD-10-CM | POA: Diagnosis not present

## 2024-03-09 DIAGNOSIS — K219 Gastro-esophageal reflux disease without esophagitis: Secondary | ICD-10-CM | POA: Diagnosis not present

## 2024-03-09 DIAGNOSIS — E875 Hyperkalemia: Secondary | ICD-10-CM | POA: Diagnosis not present

## 2024-04-21 DIAGNOSIS — D485 Neoplasm of uncertain behavior of skin: Secondary | ICD-10-CM | POA: Diagnosis not present

## 2024-04-21 DIAGNOSIS — L570 Actinic keratosis: Secondary | ICD-10-CM | POA: Diagnosis not present

## 2024-04-21 DIAGNOSIS — I781 Nevus, non-neoplastic: Secondary | ICD-10-CM | POA: Diagnosis not present

## 2024-04-21 DIAGNOSIS — L821 Other seborrheic keratosis: Secondary | ICD-10-CM | POA: Diagnosis not present

## 2024-04-21 DIAGNOSIS — B079 Viral wart, unspecified: Secondary | ICD-10-CM | POA: Diagnosis not present

## 2024-04-21 DIAGNOSIS — L814 Other melanin hyperpigmentation: Secondary | ICD-10-CM | POA: Diagnosis not present

## 2024-06-15 DIAGNOSIS — I10 Essential (primary) hypertension: Secondary | ICD-10-CM | POA: Diagnosis not present

## 2024-06-15 DIAGNOSIS — E782 Mixed hyperlipidemia: Secondary | ICD-10-CM | POA: Diagnosis not present

## 2024-06-15 DIAGNOSIS — E118 Type 2 diabetes mellitus with unspecified complications: Secondary | ICD-10-CM | POA: Diagnosis not present

## 2024-06-22 DIAGNOSIS — E118 Type 2 diabetes mellitus with unspecified complications: Secondary | ICD-10-CM | POA: Diagnosis not present

## 2024-06-22 DIAGNOSIS — I10 Essential (primary) hypertension: Secondary | ICD-10-CM | POA: Diagnosis not present

## 2024-06-22 DIAGNOSIS — Z79899 Other long term (current) drug therapy: Secondary | ICD-10-CM | POA: Diagnosis not present

## 2024-06-22 DIAGNOSIS — E559 Vitamin D deficiency, unspecified: Secondary | ICD-10-CM | POA: Diagnosis not present

## 2024-06-22 DIAGNOSIS — E876 Hypokalemia: Secondary | ICD-10-CM | POA: Diagnosis not present

## 2024-06-22 DIAGNOSIS — E669 Obesity, unspecified: Secondary | ICD-10-CM | POA: Diagnosis not present

## 2024-06-22 DIAGNOSIS — E782 Mixed hyperlipidemia: Secondary | ICD-10-CM | POA: Diagnosis not present

## 2024-06-22 DIAGNOSIS — N3281 Overactive bladder: Secondary | ICD-10-CM | POA: Diagnosis not present

## 2024-06-22 DIAGNOSIS — R600 Localized edema: Secondary | ICD-10-CM | POA: Diagnosis not present

## 2024-06-22 DIAGNOSIS — K219 Gastro-esophageal reflux disease without esophagitis: Secondary | ICD-10-CM | POA: Diagnosis not present

## 2024-06-22 DIAGNOSIS — E875 Hyperkalemia: Secondary | ICD-10-CM | POA: Diagnosis not present

## 2024-07-31 ENCOUNTER — Emergency Department

## 2024-07-31 ENCOUNTER — Emergency Department
Admission: EM | Admit: 2024-07-31 | Discharge: 2024-07-31 | Disposition: A | Discharge status: Home / Self Care | Attending: Emergency Medicine | Admitting: Emergency Medicine

## 2024-07-31 ENCOUNTER — Other Ambulatory Visit: Payer: Self-pay

## 2024-07-31 DIAGNOSIS — S0993XA Unspecified injury of face, initial encounter: Secondary | ICD-10-CM | POA: Diagnosis not present

## 2024-07-31 DIAGNOSIS — Y92481 Parking lot as the place of occurrence of the external cause: Secondary | ICD-10-CM | POA: Insufficient documentation

## 2024-07-31 DIAGNOSIS — I10 Essential (primary) hypertension: Secondary | ICD-10-CM | POA: Diagnosis not present

## 2024-07-31 DIAGNOSIS — M25531 Pain in right wrist: Secondary | ICD-10-CM | POA: Insufficient documentation

## 2024-07-31 DIAGNOSIS — E119 Type 2 diabetes mellitus without complications: Secondary | ICD-10-CM | POA: Insufficient documentation

## 2024-07-31 DIAGNOSIS — M19031 Primary osteoarthritis, right wrist: Secondary | ICD-10-CM | POA: Diagnosis not present

## 2024-07-31 DIAGNOSIS — W01198A Fall on same level from slipping, tripping and stumbling with subsequent striking against other object, initial encounter: Secondary | ICD-10-CM | POA: Diagnosis not present

## 2024-07-31 DIAGNOSIS — W19XXXA Unspecified fall, initial encounter: Secondary | ICD-10-CM | POA: Diagnosis not present

## 2024-07-31 DIAGNOSIS — M1811 Unilateral primary osteoarthritis of first carpometacarpal joint, right hand: Secondary | ICD-10-CM | POA: Diagnosis not present

## 2024-07-31 DIAGNOSIS — Y9301 Activity, walking, marching and hiking: Secondary | ICD-10-CM | POA: Insufficient documentation

## 2024-07-31 DIAGNOSIS — S01111A Laceration without foreign body of right eyelid and periocular area, initial encounter: Secondary | ICD-10-CM | POA: Diagnosis not present

## 2024-07-31 DIAGNOSIS — S0990XA Unspecified injury of head, initial encounter: Secondary | ICD-10-CM | POA: Insufficient documentation

## 2024-07-31 DIAGNOSIS — S0083XA Contusion of other part of head, initial encounter: Secondary | ICD-10-CM

## 2024-07-31 DIAGNOSIS — S0181XA Laceration without foreign body of other part of head, initial encounter: Secondary | ICD-10-CM

## 2024-07-31 MED ORDER — ACETAMINOPHEN 325 MG PO TABS
650.0000 mg | ORAL_TABLET | Freq: Once | ORAL | Status: AC
  Administered 2024-07-31: 650 mg via ORAL
  Filled 2024-07-31: qty 2

## 2024-07-31 MED ORDER — LIDOCAINE HCL (PF) 1 % IJ SOLN
5.0000 mL | Freq: Once | INTRAMUSCULAR | Status: AC
  Administered 2024-07-31: 5 mL
  Filled 2024-07-31: qty 5

## 2024-07-31 NOTE — ED Triage Notes (Addendum)
 BIB EMS

## 2024-07-31 NOTE — ED Notes (Signed)
 First nurse note: To ED via [facility] from parking lot for mechanical fall from tripping on a speed bump. Bldg controlled, 1 inch lac to R eyebrow. No LOC. No blood thinners. Alert oriented X4, ambulatory on scene without assistance.  HR 105, 151/91 hx HTN, 95% RA. No CBG or EKG done. Head is wrapped.

## 2024-07-31 NOTE — ED Triage Notes (Signed)
 To ED via [facility] from parking lot for mechanical fall from tripping on a speed bump. Bldg controlled, 1 inch lac to R eyebrow. No LOC. No blood thinners. Alert oriented X4, ambulatory on scene without assistance.  HR 105, 151/91 hx HTN, 95% RA. No CBG or EKG done. Head is wrapped.

## 2024-07-31 NOTE — ED Provider Notes (Signed)
 [facility] Emergency Department Provider Note     Event Date/Time   First MD Initiated Contact with Patient 07/31/24 1522     (approximate)   History   Fall and Laceration   HPI  Makayla Lucas is a 78 y.o. female with a history of HTN, diabetes presents to the ED following a mechanical fall.  Patient reports she was walking to her car from a grocery store and tripped over a speed bump falling forward and hitting her head sustaining a laceration above right eyebrow.  Denies LOC.  Denies anticoagulation use.  Also notes pain to right wrist.  Denies decreased movement and numbness.     Physical Exam   Triage Vital Signs: ED Triage Vitals  Encounter Vitals Group     BP 07/31/24 1425 (!) 159/79     Girls Systolic BP Percentile --      Girls Diastolic BP Percentile --      Boys Systolic BP Percentile --      Boys Diastolic BP Percentile --      Pulse Rate 07/31/24 1425 98     Resp 07/31/24 1425 20     Temp 07/31/24 1425 97.8 F (36.6 C)     Temp Source 07/31/24 1425 Oral     SpO2 07/31/24 1425 100 %     Weight 07/31/24 1426 210 lb (95.3 kg)     Height 07/31/24 1426 5' 4 (1.626 m)     Head Circumference --      Peak Flow --      Pain Score 07/31/24 1425 4     Pain Loc --      Pain Education --      Exclude from Growth Chart --     Most recent vital signs: Vitals:   07/31/24 1425 07/31/24 1756  BP: (!) 159/79 (!) 145/78  Pulse: 98 88  Resp: 20 19  Temp: 97.8 F (36.6 C) 97.8 F (36.6 C)  SpO2: 100% 98%   General: Alert and oriented. INAD.     Head:  NCAT. Non tender to palpation over facial bones. Noted 1 cm laceration superior to right eyebrow w/ ecchymosis to upper right eyelid.  Eyes:  PERRLA. EOMI.  Ears:  No postauricular ecchymosis Nose:   No septal hematoma. Neck:   No cervical spine tenderness to palpation. Full ROM without difficulty.  CV:  Good peripheral perfusion.  RESP:  Normal effort.  MSK:   Right wrist reveals  noted swelling however patient states this is her baseline.  Full range of motion with flexion and extension.  Radial pulses palpated and equal bilaterally.  Neurovascular status intact all throughout.  Good capillary refills.  NEURO: Cranial nerves intact. No focal deficits. Speech is clear. Sensation and motor function intact. Normal muscle strength of UE & LE.   ED Results / Procedures / Treatments   Labs (all labs ordered are listed, but only abnormal results are displayed) Labs Reviewed - No data to display  RADIOLOGY  I personally viewed and evaluated these images as part of my medical decision making, as well as reviewing the written report by the radiologist.  ED Provider Interpretation: CT head and cervical appears normal no acute abnormalities will confirm with final radiology read.  Right wrist reveals no acute bony abnormality.   DG Wrist Complete Right Result Date: 07/31/2024 CLINICAL DATA:  Clemens, wrist pain EXAM: RIGHT WRIST - COMPLETE 3+ VIEW COMPARISON:  None Available. FINDINGS: Frontal, oblique, lateral, and  ulnar deviated views of the right wrist are obtained. There are no acute displaced fractures. Diffuse osteoarthritis throughout the right wrist, greatest in the radial aspect of the carpus and first carpometacarpal joint. Mild dorsal soft tissue swelling. IMPRESSION: 1. No acute displaced fracture. 2. 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 3. Mild dorsal soft tissue swelling. Electronically Signed   By: Ozell Daring M.D.   On: 07/31/2024 16:03   CT Head Wo Contrast Result Date: 07/31/2024 CLINICAL DATA:  Facial trauma, blunt EXAM: CT HEAD WITHOUT CONTRAST CT CERVICAL SPINE WITHOUT CONTRAST TECHNIQUE: Multidetector CT imaging of the head and cervical spine was performed following the standard protocol without intravenous contrast. Multiplanar CT image reconstructions of the cervical spine were also generated.  RADIATION DOSE REDUCTION: This exam was performed according to the departmental dose-optimization program which includes automated exposure control, adjustment of the mA and/or kV according to patient size and/or use of iterative reconstruction technique. COMPARISON:  None Available. FINDINGS: CT HEAD FINDINGS Brain: Prominence of the lateral ventricles may be related to central predominant atrophy, although a component of normal pressure/communicating hydrocephalus cannot be excluded. Trace patchy and confluent areas of decreased attenuation are noted throughout the deep and periventricular white matter of the cerebral hemispheres bilaterally, compatible with chronic microvascular ischemic disease. No evidence of large-territorial acute infarction. No parenchymal hemorrhage. No mass lesion. No extra-axial collection. No mass effect or midline shift. No hydrocephalus. Basilar cisterns are patent. Vascular: No hyperdense vessel. Skull: No acute fracture or focal lesion. Sinuses/Orbits: Paranasal sinuses and mastoid air cells are clear. The orbits are unremarkable. Other: None. CT CERVICAL SPINE FINDINGS Alignment: Grade 1 anterolisthesis of C4 on C5 and C7 on T1. Skull base and vertebrae: Multilevel moderate degenerative changes of the spine. No acute fracture. No aggressive appearing focal osseous lesion or focal pathologic process. Soft tissues and spinal canal: No prevertebral fluid or swelling. No visible canal hematoma. Upper chest: Unremarkable. Other: Atherosclerotic plaque of the aortic arch and its main branches. IMPRESSION: 1. No acute intracranial abnormality. 2. No acute displaced fracture or traumatic listhesis of the cervical spine. 3. Prominence of the lateral ventricles may be related to central predominant atrophy, although a component of normal pressure/communicating hydrocephalus cannot be excluded. Electronically Signed   By: Morgane  Naveau M.D.   On: 07/31/2024 15:14   CT Cervical Spine Wo  Contrast Result Date: 07/31/2024 CLINICAL DATA:  Facial trauma, blunt EXAM: CT HEAD WITHOUT CONTRAST CT CERVICAL SPINE WITHOUT CONTRAST TECHNIQUE: Multidetector CT imaging of the head and cervical spine was performed following the standard protocol without intravenous contrast. Multiplanar CT image reconstructions of the cervical spine were also generated. RADIATION DOSE REDUCTION: This exam was performed according to the departmental dose-optimization program which includes automated exposure control, adjustment of the mA and/or kV according to patient size and/or use of iterative reconstruction technique. COMPARISON:  None Available. FINDINGS: CT HEAD FINDINGS Brain: Prominence of the lateral ventricles may be related to central predominant atrophy, although a component of normal pressure/communicating hydrocephalus cannot be excluded. Trace patchy and confluent areas of decreased attenuation are noted throughout the deep and periventricular white matter of the cerebral hemispheres bilaterally, compatible with chronic microvascular ischemic disease. No evidence of large-territorial acute infarction. No parenchymal hemorrhage. No mass lesion. No extra-axial collection. No mass effect or midline shift. No hydrocephalus. Basilar cisterns are patent. Vascular: No hyperdense vessel. Skull: No acute fracture or focal lesion. Sinuses/Orbits: Paranasal sinuses and mastoid air cells are  clear. The orbits are unremarkable. Other: None. CT CERVICAL SPINE FINDINGS Alignment: Grade 1 anterolisthesis of C4 on C5 and C7 on T1. Skull base and vertebrae: Multilevel moderate degenerative changes of the spine. No acute fracture. No aggressive appearing focal osseous lesion or focal pathologic process. Soft tissues and spinal canal: No prevertebral fluid or swelling. No visible canal hematoma. Upper chest: Unremarkable. Other: Atherosclerotic plaque of the aortic arch and its main branches. IMPRESSION: 1. No acute intracranial  abnormality. 2. No acute displaced fracture or traumatic listhesis of the cervical spine. 3. Prominence of the lateral ventricles may be related to central predominant atrophy, although a component of normal pressure/communicating hydrocephalus cannot be excluded. Electronically Signed   By: Morgane  Naveau M.D.   On: 07/31/2024 15:14   PROCEDURES:  Critical Care performed: No  .Laceration Repair  Date/Time: 07/31/2024 6:14 PM  Performed by: Margrette Monte A, PA-C Authorized by: Margrette Monte A, PA-C   Consent:    Consent obtained:  Verbal   Consent given by:  Patient   Risks discussed:  Pain, poor cosmetic result, poor wound healing, need for additional repair, nerve damage and infection Universal protocol:    Patient identity confirmed:  Verbally with patient Anesthesia:    Anesthesia method:  Local infiltration   Local anesthetic:  Lidocaine  1% w/o epi Laceration details:    Location:  Face   Face location:  R eyebrow   Length (cm):  1.5   Depth (mm):  4 Exploration:    Hemostasis achieved with:  Direct pressure   Wound exploration: wound explored through full range of motion and entire depth of wound visualized   Treatment:    Area cleansed with:  Povidone-iodine and saline   Amount of cleaning:  Standard   Irrigation solution:  Sterile saline   Irrigation method:  Pressure wash and syringe   Layers/structures repaired:  Deep dermal/superficial fascia Deep dermal/superficial fascia:    Suture size:  6-0   Suture material:  Vicryl   Suture technique:  Figure eight   Number of sutures:  1 Skin repair:    Repair method:  Sutures   Suture size:  5-0   Suture material:  Nylon   Suture technique:  Simple interrupted   Number of sutures:  3 Approximation:    Approximation:  Close Repair type:    Repair type:  Simple Post-procedure details:    Dressing:  Non-adherent dressing   Procedure completion:  Tolerated   MEDICATIONS ORDERED IN ED: Medications   acetaminophen  (TYLENOL ) tablet 650 mg (650 mg Oral Given 07/31/24 1614)  lidocaine  (PF) (XYLOCAINE ) 1 % injection 5 mL (5 mLs Infiltration Given by Other 07/31/24 1715)   IMPRESSION / MDM / ASSESSMENT AND PLAN / ED COURSE  I reviewed the triage vital signs and the nursing notes.                               78 y.o. female presents to the emergency department for evaluation and treatment of mechanical fall this sustaining laceration to upper right eyebrow. See HPI for further details.   Differential diagnosis includes, but is not limited to closed head injury, subdural/epidural hematoma, contusion, laceration, abrasion   Patient's presentation is most consistent with acute complicated illness / injury requiring diagnostic workup.  Patient is alert oriented.  She is hemodynamic stable.  Physical exam findings are as stated above.  Normal neuroexam.  CT head and cervical spine obtained  in triage and are reassuring.  Right wrist reveals no acute bony abnormalities.  Will place in wrist brace for comfort.  Please see procedure note for laceration repair.  Patient tolerated well. Dressing applied.  Advised to return to ED or follow-up with PCP in 7 to 10 days for suture removal.  Patient verbalized understanding.  She is in stable condition for discharge home.  ED return precautions discussed.  All question concerns were addressed during this ED visit.  FINAL CLINICAL IMPRESSION(S) / ED DIAGNOSES   Final diagnoses:  Fall, initial encounter  Facial laceration, initial encounter  Contusion of face, initial encounter   Rx / DC Orders   ED Discharge Orders     None      Note:  This document was prepared using Dragon voice recognition software and may include unintentional dictation errors.    Margrette, Caitlynn Ju A, PA-C 07/31/24 1818    Jacolyn Pae, MD 07/31/24 [phone]

## 2024-07-31 NOTE — ED Notes (Signed)
 Took patient to the bathroom in a wheelchair. Pt is back in her room, call light in reach.

## 2024-07-31 NOTE — Discharge Instructions (Signed)
 Please follow-up with your primary care provider or return to the ED for suture removal in 7 to 10 days.   keep sutures dry for first 24 hrs. Keep suture site of clean & dry. Gently use soap & water  after first 24 hrs. DO NOT USE alcohol, hydrogen peroxide etc, to clean skin. You may cover the incision with clean gauze & replace it after your daily shower for your comfort.   Take tylenol  for pain as needed.

## 2024-08-09 DIAGNOSIS — E1165 Type 2 diabetes mellitus with hyperglycemia: Secondary | ICD-10-CM | POA: Diagnosis not present

## 2024-08-09 DIAGNOSIS — Z860100 Personal history of colon polyps, unspecified: Secondary | ICD-10-CM | POA: Diagnosis not present

## 2024-08-09 DIAGNOSIS — E876 Hypokalemia: Secondary | ICD-10-CM | POA: Diagnosis not present

## 2024-08-09 DIAGNOSIS — Z6838 Body mass index (BMI) 38.0-38.9, adult: Secondary | ICD-10-CM | POA: Diagnosis not present

## 2024-08-09 DIAGNOSIS — K219 Gastro-esophageal reflux disease without esophagitis: Secondary | ICD-10-CM | POA: Diagnosis not present

## 2024-08-09 DIAGNOSIS — E782 Mixed hyperlipidemia: Secondary | ICD-10-CM | POA: Diagnosis not present

## 2024-08-09 DIAGNOSIS — Z7689 Persons encountering health services in other specified circumstances: Secondary | ICD-10-CM | POA: Diagnosis not present

## 2024-08-09 DIAGNOSIS — E118 Type 2 diabetes mellitus with unspecified complications: Secondary | ICD-10-CM | POA: Diagnosis not present

## 2024-08-09 DIAGNOSIS — E1169 Type 2 diabetes mellitus with other specified complication: Secondary | ICD-10-CM | POA: Diagnosis not present

## 2024-08-09 DIAGNOSIS — I10 Essential (primary) hypertension: Secondary | ICD-10-CM | POA: Diagnosis not present

## 2024-08-09 DIAGNOSIS — E875 Hyperkalemia: Secondary | ICD-10-CM | POA: Diagnosis not present

## 2024-09-01 DIAGNOSIS — Z23 Encounter for immunization: Secondary | ICD-10-CM | POA: Diagnosis not present

## 2024-09-21 DIAGNOSIS — I10 Essential (primary) hypertension: Secondary | ICD-10-CM | POA: Diagnosis not present

## 2024-09-21 DIAGNOSIS — E782 Mixed hyperlipidemia: Secondary | ICD-10-CM | POA: Diagnosis not present

## 2024-09-21 DIAGNOSIS — E559 Vitamin D deficiency, unspecified: Secondary | ICD-10-CM | POA: Diagnosis not present

## 2024-09-21 DIAGNOSIS — E118 Type 2 diabetes mellitus with unspecified complications: Secondary | ICD-10-CM | POA: Diagnosis not present

## 2024-09-28 DIAGNOSIS — E559 Vitamin D deficiency, unspecified: Secondary | ICD-10-CM | POA: Diagnosis not present

## 2024-09-28 DIAGNOSIS — Z0001 Encounter for general adult medical examination with abnormal findings: Secondary | ICD-10-CM | POA: Diagnosis not present

## 2024-09-28 DIAGNOSIS — I10 Essential (primary) hypertension: Secondary | ICD-10-CM | POA: Diagnosis not present

## 2024-09-28 DIAGNOSIS — E876 Hypokalemia: Secondary | ICD-10-CM | POA: Diagnosis not present

## 2024-09-28 DIAGNOSIS — Z0000 Encounter for general adult medical examination without abnormal findings: Secondary | ICD-10-CM | POA: Diagnosis not present

## 2024-09-28 DIAGNOSIS — E118 Type 2 diabetes mellitus with unspecified complications: Secondary | ICD-10-CM | POA: Diagnosis not present

## 2024-09-28 DIAGNOSIS — Z860100 Personal history of colon polyps, unspecified: Secondary | ICD-10-CM | POA: Diagnosis not present

## 2024-09-28 DIAGNOSIS — K219 Gastro-esophageal reflux disease without esophagitis: Secondary | ICD-10-CM | POA: Diagnosis not present

## 2024-09-28 DIAGNOSIS — E782 Mixed hyperlipidemia: Secondary | ICD-10-CM | POA: Diagnosis not present

## 2024-09-28 DIAGNOSIS — E875 Hyperkalemia: Secondary | ICD-10-CM | POA: Diagnosis not present

## 2024-09-28 DIAGNOSIS — Z23 Encounter for immunization: Secondary | ICD-10-CM | POA: Diagnosis not present

## 2024-09-29 DIAGNOSIS — E119 Type 2 diabetes mellitus without complications: Secondary | ICD-10-CM | POA: Diagnosis not present

## 2024-09-29 DIAGNOSIS — H25813 Combined forms of age-related cataract, bilateral: Secondary | ICD-10-CM | POA: Diagnosis not present

## 2024-09-30 ENCOUNTER — Other Ambulatory Visit (HOSPITAL_COMMUNITY): Payer: Self-pay | Admitting: Nurse Practitioner

## 2024-09-30 DIAGNOSIS — M810 Age-related osteoporosis without current pathological fracture: Secondary | ICD-10-CM

## 2024-09-30 DIAGNOSIS — Z13820 Encounter for screening for osteoporosis: Secondary | ICD-10-CM

## 2024-10-04 ENCOUNTER — Other Ambulatory Visit (HOSPITAL_COMMUNITY): Payer: Self-pay | Admitting: Nurse Practitioner

## 2024-10-04 DIAGNOSIS — Z1231 Encounter for screening mammogram for malignant neoplasm of breast: Secondary | ICD-10-CM

## 2024-10-15 DIAGNOSIS — Z23 Encounter for immunization: Secondary | ICD-10-CM | POA: Diagnosis not present

## 2024-10-18 ENCOUNTER — Ambulatory Visit (HOSPITAL_COMMUNITY)
Admission: RE | Admit: 2024-10-18 | Discharge: 2024-10-18 | Disposition: A | Discharge status: Home / Self Care | Source: Ambulatory Visit | Attending: Nurse Practitioner | Admitting: Nurse Practitioner

## 2024-10-18 DIAGNOSIS — Z1231 Encounter for screening mammogram for malignant neoplasm of breast: Secondary | ICD-10-CM | POA: Insufficient documentation

## 2024-10-18 DIAGNOSIS — M810 Age-related osteoporosis without current pathological fracture: Secondary | ICD-10-CM | POA: Diagnosis not present

## 2024-10-18 DIAGNOSIS — M85832 Other specified disorders of bone density and structure, left forearm: Secondary | ICD-10-CM | POA: Diagnosis not present

## 2024-10-18 DIAGNOSIS — Z13820 Encounter for screening for osteoporosis: Secondary | ICD-10-CM | POA: Insufficient documentation

## 2024-10-18 DIAGNOSIS — Z780 Asymptomatic menopausal state: Secondary | ICD-10-CM | POA: Diagnosis not present

## 2024-11-19 DIAGNOSIS — H25813 Combined forms of age-related cataract, bilateral: Secondary | ICD-10-CM | POA: Diagnosis not present

## 2024-11-19 DIAGNOSIS — E119 Type 2 diabetes mellitus without complications: Secondary | ICD-10-CM | POA: Diagnosis not present
